# Patient Record
Sex: Male | Born: 1970 | Race: White | Hispanic: No | Marital: Married | Smoking: Current every day smoker
Health system: Southern US, Community
[De-identification: ages and names within clinical notes are randomized; demographics above are authoritative.]

## PROBLEM LIST (undated history)

## (undated) DIAGNOSIS — E039 Hypothyroidism, unspecified: Secondary | ICD-10-CM

## (undated) DIAGNOSIS — G4733 Obstructive sleep apnea (adult) (pediatric): Secondary | ICD-10-CM

## (undated) DIAGNOSIS — E785 Hyperlipidemia, unspecified: Secondary | ICD-10-CM

## (undated) DIAGNOSIS — I10 Essential (primary) hypertension: Secondary | ICD-10-CM

## (undated) HISTORY — DX: Obstructive sleep apnea (adult) (pediatric): G47.33

## (undated) HISTORY — DX: Hyperlipidemia, unspecified: E78.5

## (undated) HISTORY — DX: Essential (primary) hypertension: I10

## (undated) HISTORY — DX: Hypothyroidism, unspecified: E03.9

## (undated) HISTORY — PX: OTHER SURGICAL HISTORY: SHX169

---

## 2000-09-21 ENCOUNTER — Emergency Department (HOSPITAL_COMMUNITY): Admission: EM | Admit: 2000-09-21 | Discharge: 2000-09-21 | Payer: Self-pay | Admitting: *Deleted

## 2003-03-12 ENCOUNTER — Ambulatory Visit (HOSPITAL_COMMUNITY): Admission: RE | Admit: 2003-03-12 | Discharge: 2003-03-12 | Payer: Self-pay | Admitting: Family Medicine

## 2003-03-19 ENCOUNTER — Ambulatory Visit (HOSPITAL_COMMUNITY): Admission: RE | Admit: 2003-03-19 | Discharge: 2003-03-19 | Payer: Self-pay | Admitting: Family Medicine

## 2003-04-07 ENCOUNTER — Encounter (HOSPITAL_COMMUNITY): Admission: RE | Admit: 2003-04-07 | Discharge: 2003-05-07 | Payer: Self-pay | Admitting: Family Medicine

## 2003-05-14 ENCOUNTER — Encounter (HOSPITAL_COMMUNITY): Admission: RE | Admit: 2003-05-14 | Discharge: 2003-06-13 | Payer: Self-pay | Admitting: Family Medicine

## 2003-05-20 ENCOUNTER — Encounter (HOSPITAL_COMMUNITY): Admission: RE | Admit: 2003-05-20 | Discharge: 2003-06-19 | Payer: Self-pay | Admitting: Family Medicine

## 2004-03-31 ENCOUNTER — Ambulatory Visit (HOSPITAL_COMMUNITY): Admission: RE | Admit: 2004-03-31 | Discharge: 2004-03-31 | Payer: Self-pay | Admitting: Family Medicine

## 2005-10-14 ENCOUNTER — Emergency Department (HOSPITAL_COMMUNITY): Admission: EM | Admit: 2005-10-14 | Discharge: 2005-10-14 | Payer: Self-pay | Admitting: Emergency Medicine

## 2005-12-14 ENCOUNTER — Ambulatory Visit (HOSPITAL_COMMUNITY): Admission: RE | Admit: 2005-12-14 | Discharge: 2005-12-14 | Payer: Self-pay | Admitting: Family Medicine

## 2006-09-27 ENCOUNTER — Emergency Department (HOSPITAL_COMMUNITY): Admission: EM | Admit: 2006-09-27 | Discharge: 2006-09-27 | Payer: Self-pay | Admitting: Emergency Medicine

## 2007-05-24 IMAGING — CR DG CHEST 2V
2 series · 2 of 2 positions shown · non-contrast
Comparison: No prior studies.

CLINICAL DATA: Fever.  Tachycardia.  Weakness.  Chest congestion.  
CHEST - 2 VIEW:

[view not recorded (1 of 2)]
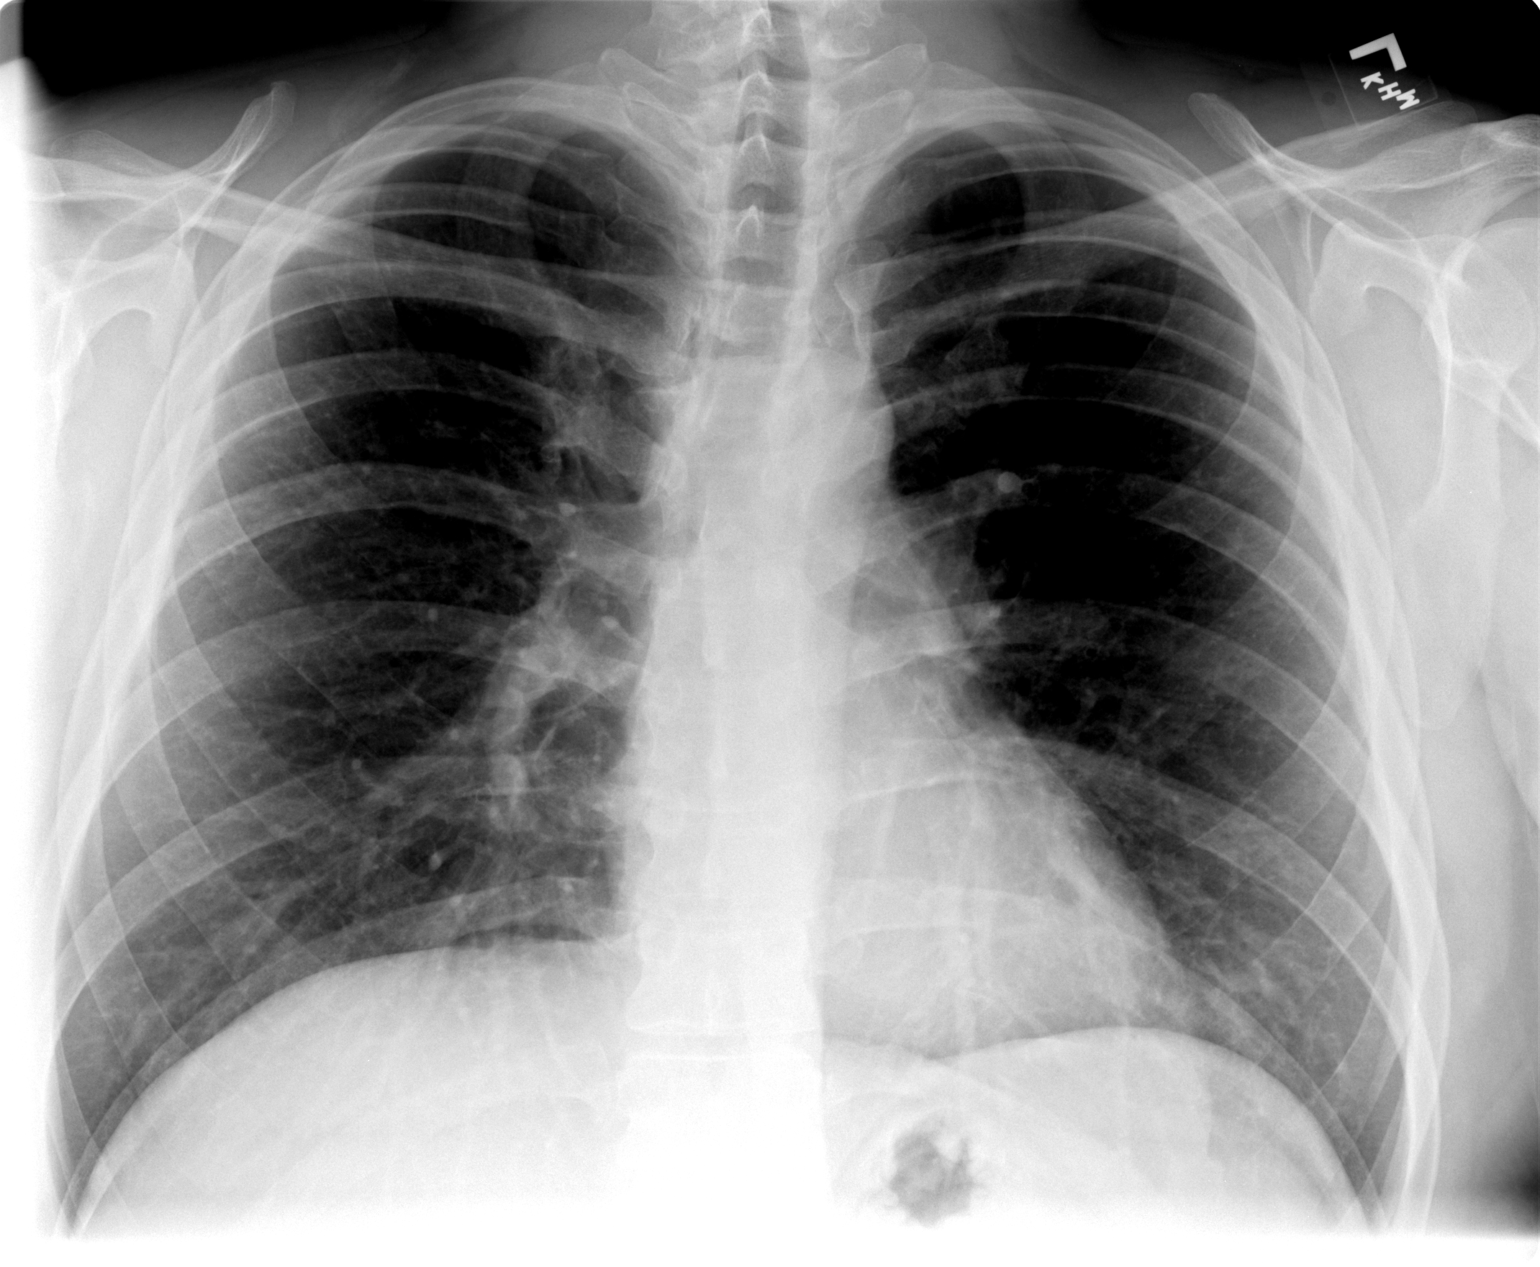

[view not recorded (2 of 2)]
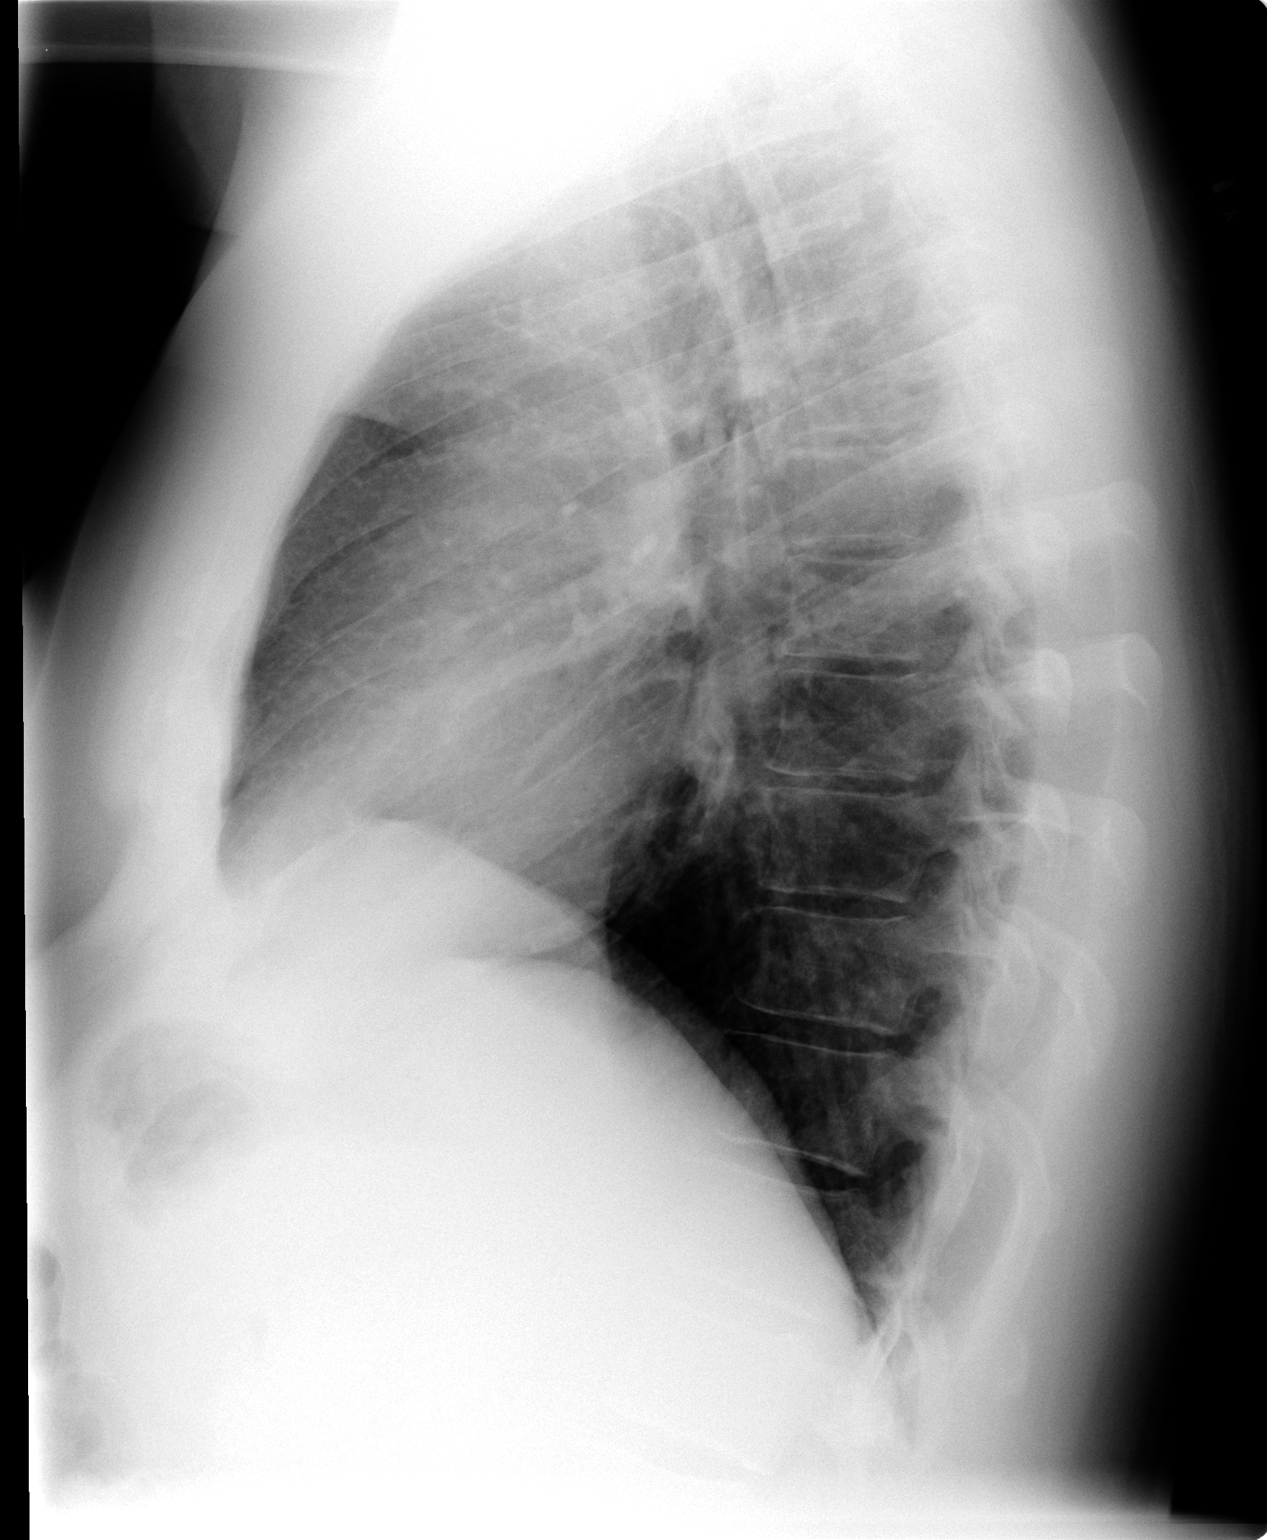

[2 of 2 positions shown; findings below may reference images not displayed]

FINDINGS: The heart and mediastinum appear unremarkable. 
There is a 5 mm density which could represent a nodule projecting over the posterolateral portion of the left 7th rib.  In addition, there is a rounded right suprahilar density which likely actually represents confluence of upper zone pulmonary vessels and is likely to represent adenopathy or a true pulmonary nodule.  Vague density in the lingula is likely vascular.  
No air space opacity is identified.  Heart and mediastinum appear unremarkable.
IMPRESSION: 5 mm vague density projects in the left mid chest.  There is also a suggestion of a rounded right suprahilar opacity which probably simply represents confluent vessels.  However, I do recommend either a chest CT or follow-up chest radiography in 1 months time in order to reevaluate.

## 2007-07-24 IMAGING — CR DG CHEST 2V
2 series · 2 of 2 positions shown · non-contrast
Comparison: 10/14/05.

CLINICAL DATA: Bronchitis.  Shortness of breath.
 CHEST - 2 VIEW: 
 PA and lateral chest - 12/14/05.

[view not recorded (1 of 2)]
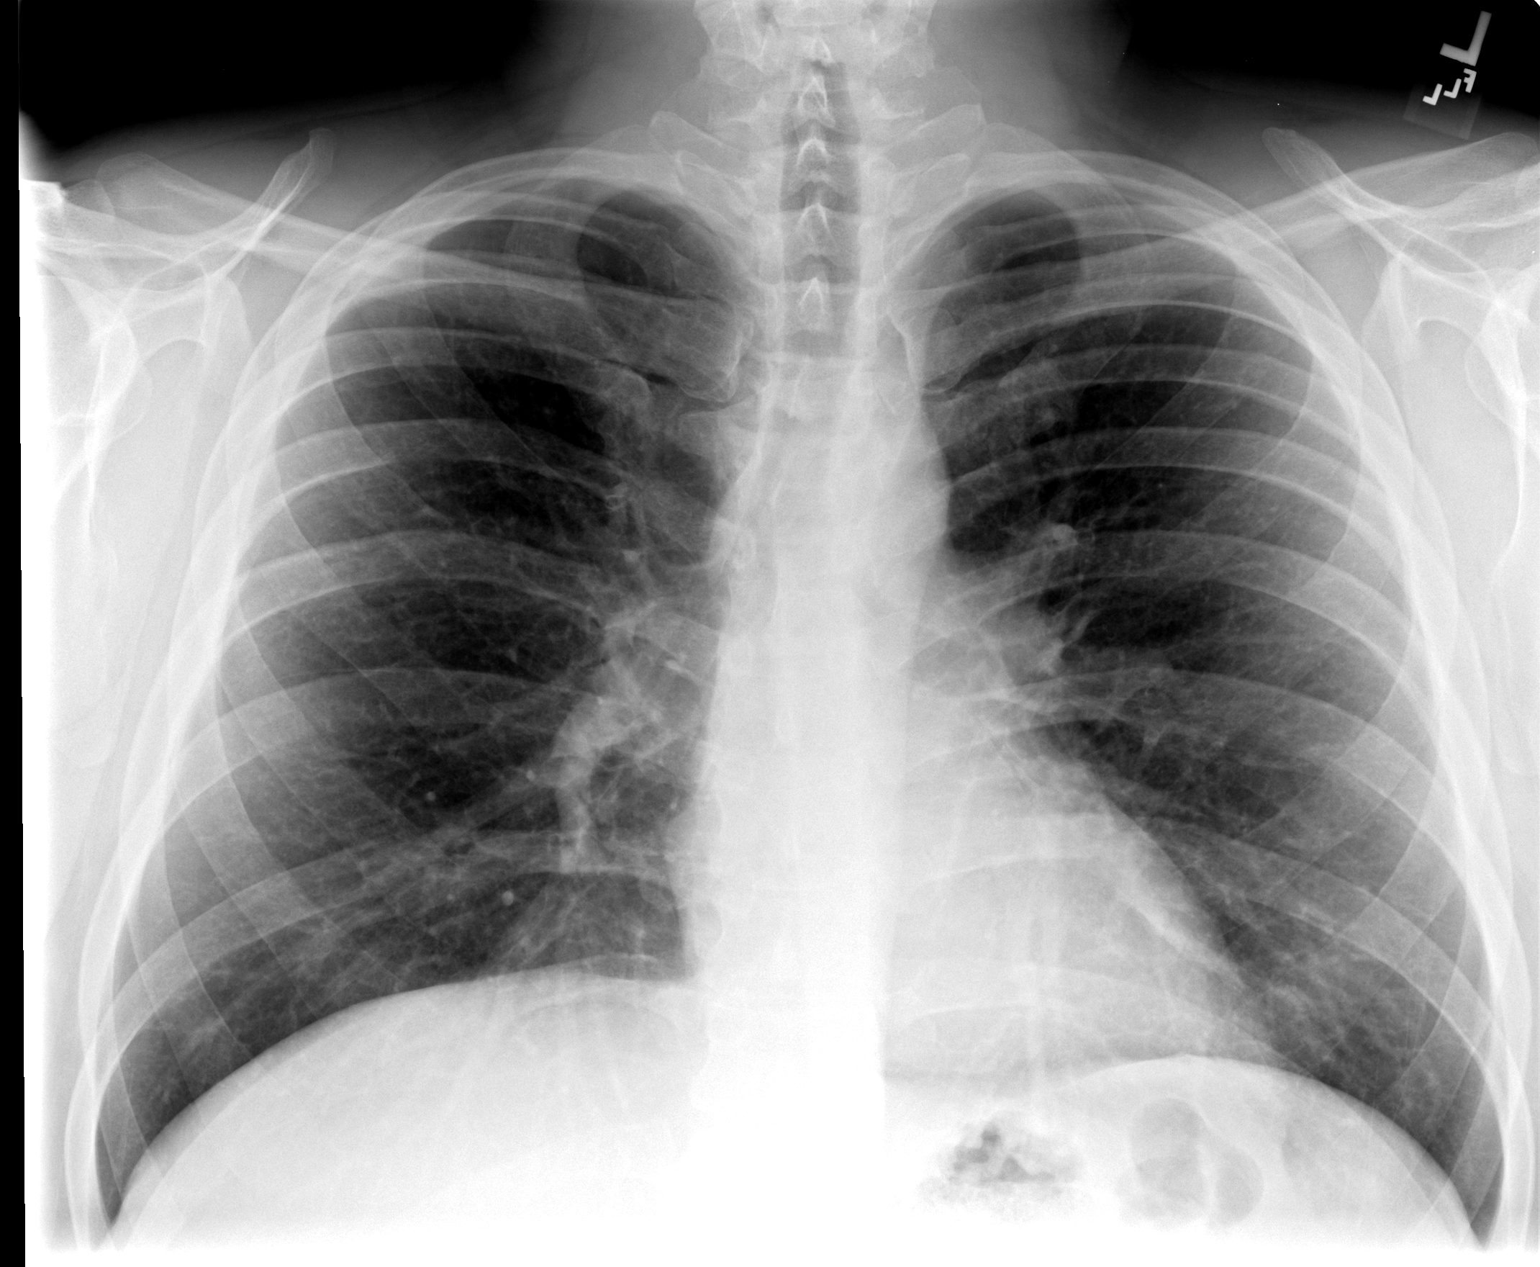

[view not recorded (2 of 2)]
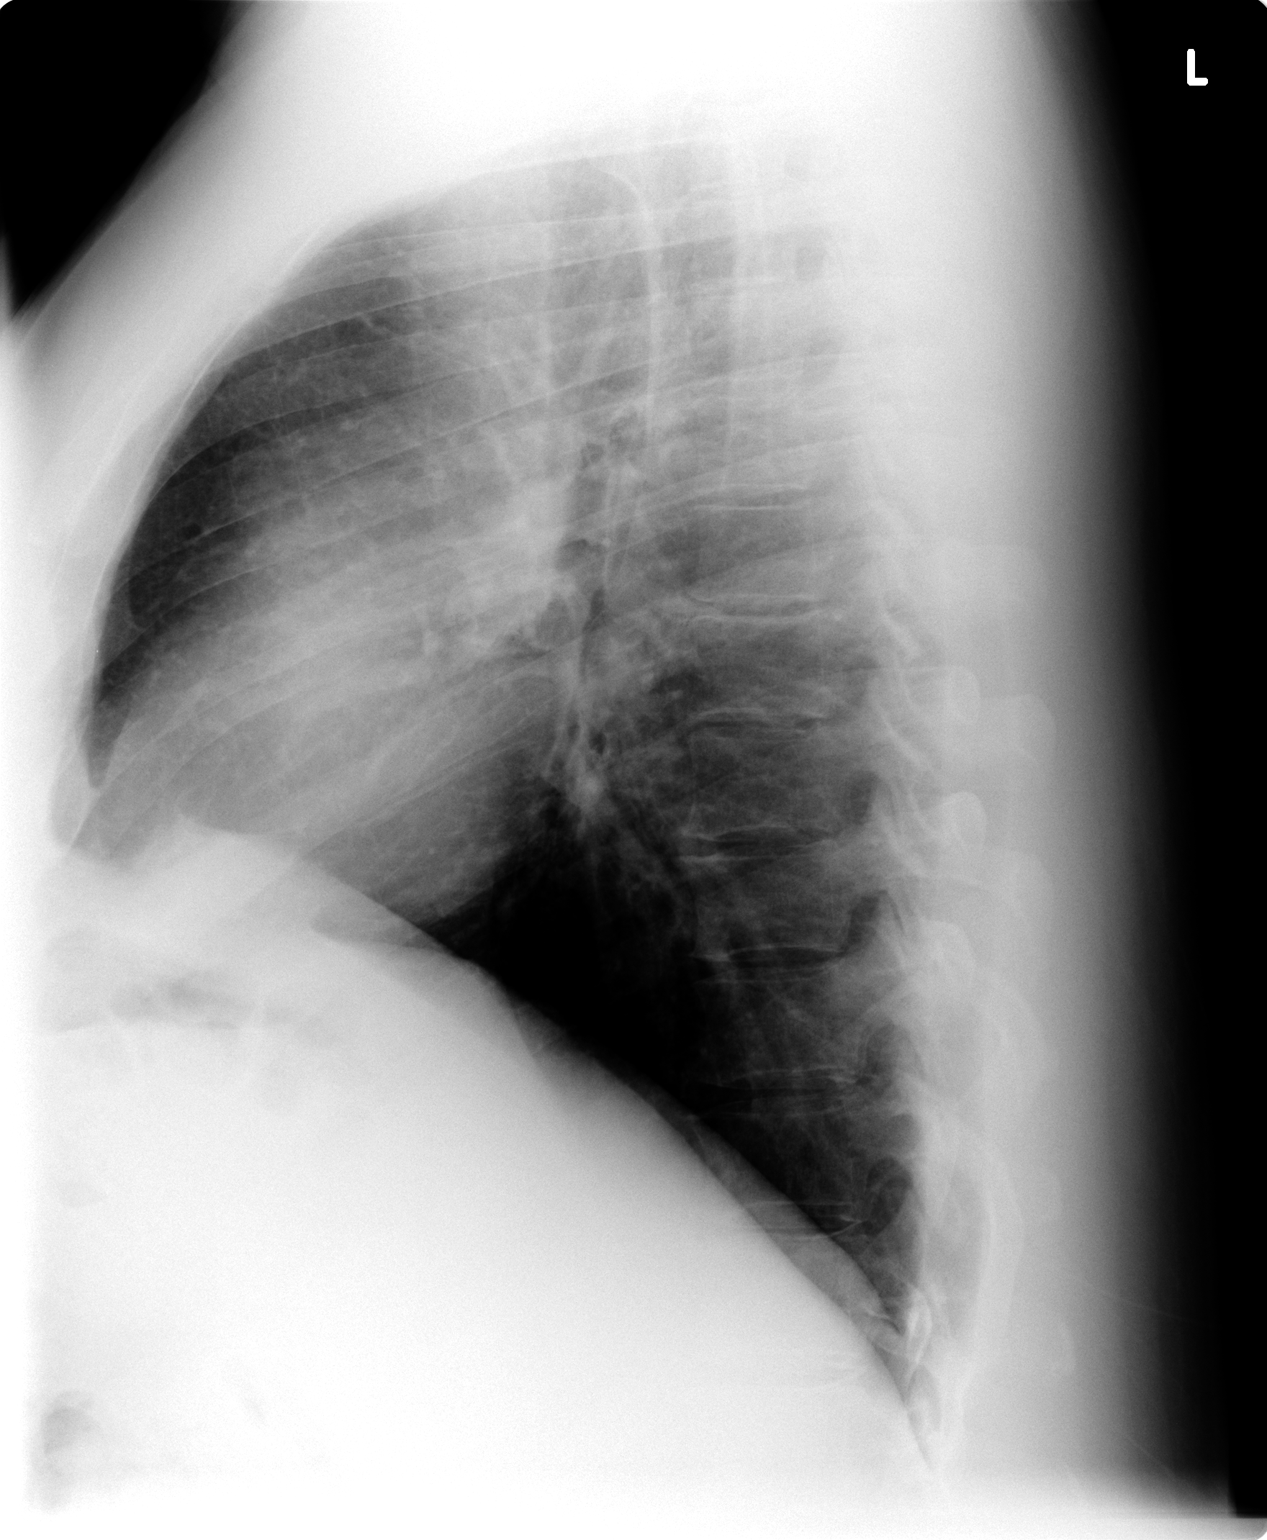

[2 of 2 positions shown; findings below may reference images not displayed]

FINDINGS: The lungs are clear.   The heart size is normal.   No effusion or focal bony abnormality.
IMPRESSION: No acute disease.

## 2008-11-13 ENCOUNTER — Ambulatory Visit (HOSPITAL_COMMUNITY): Admission: RE | Admit: 2008-11-13 | Discharge: 2008-11-13 | Payer: Self-pay | Admitting: Family Medicine

## 2010-02-13 ENCOUNTER — Emergency Department (HOSPITAL_COMMUNITY): Admission: EM | Admit: 2010-02-13 | Discharge: 2010-02-13 | Payer: Self-pay | Admitting: Emergency Medicine

## 2010-07-27 LAB — CBC
HCT: 46.8 % (ref 39.0–52.0)
Hemoglobin: 16.3 g/dL (ref 13.0–17.0)
MCH: 31 pg (ref 26.0–34.0)
MCHC: 34.9 g/dL (ref 30.0–36.0)
MCV: 88.8 fL (ref 78.0–100.0)
Platelets: 189 10*3/uL (ref 150–400)
RBC: 5.27 MIL/uL (ref 4.22–5.81)
RDW: 13 % (ref 11.5–15.5)
WBC: 14.6 10*3/uL — ABNORMAL HIGH (ref 4.0–10.5)

## 2010-07-27 LAB — URINALYSIS, ROUTINE W REFLEX MICROSCOPIC
Bilirubin Urine: NEGATIVE
Glucose, UA: NEGATIVE mg/dL
Hgb urine dipstick: NEGATIVE
Nitrite: NEGATIVE
Protein, ur: NEGATIVE mg/dL
Specific Gravity, Urine: >1.03 — ABNORMAL HIGH (ref 1.005–1.030)
Urobilinogen, UA: 0.2 mg/dL (ref 0.0–1.0)
pH: 6 (ref 5.0–8.0)

## 2010-07-27 LAB — DIFFERENTIAL
Basophils Absolute: 0.1 10*3/uL (ref 0.0–0.1)
Basophils Relative: 0 % (ref 0–1)
Eosinophils Absolute: 0.2 10*3/uL (ref 0.0–0.7)
Eosinophils Relative: 1 % (ref 0–5)
Lymphocytes Relative: 17 % (ref 12–46)
Lymphs Abs: 2.5 10*3/uL (ref 0.7–4.0)
Monocytes Absolute: 0.9 10*3/uL (ref 0.1–1.0)
Monocytes Relative: 6 % (ref 3–12)
Neutro Abs: 10.9 10*3/uL — ABNORMAL HIGH (ref 1.7–7.7)
Neutrophils Relative %: 75 % (ref 43–77)

## 2010-07-27 LAB — COMPREHENSIVE METABOLIC PANEL
ALT: 53 U/L (ref 0–53)
AST: 32 U/L (ref 0–37)
Albumin: 4.2 g/dL (ref 3.5–5.2)
Alkaline Phosphatase: 71 U/L (ref 39–117)
BUN: 7 mg/dL (ref 6–23)
CO2: 28 mEq/L (ref 19–32)
Calcium: 9.1 mg/dL (ref 8.4–10.5)
Chloride: 103 mEq/L (ref 96–112)
Creatinine, Ser: 0.77 mg/dL (ref 0.4–1.5)
GFR calc Af Amer: >60 mL/min (ref >60)
GFR calc non Af Amer: >60 mL/min (ref >60)
Glucose, Bld: 84 mg/dL (ref 70–99)
Potassium: 3.7 mEq/L (ref 3.5–5.1)
Sodium: 138 mEq/L (ref 135–145)
Total Bilirubin: 1.6 mg/dL — ABNORMAL HIGH (ref 0.3–1.2)
Total Protein: 7 g/dL (ref 6.0–8.3)

## 2010-07-27 LAB — LIPASE, BLOOD: Lipase: 42 U/L (ref 11–59)

## 2011-09-23 IMAGING — CR DG CHEST 2V
2 series · 2 of 2 positions shown · non-contrast
Comparison: Chest 12/14/2005.

CLINICAL DATA: Chest pain.  Low grade fever.

CHEST - 2 VIEW

[view not recorded (1 of 2)]
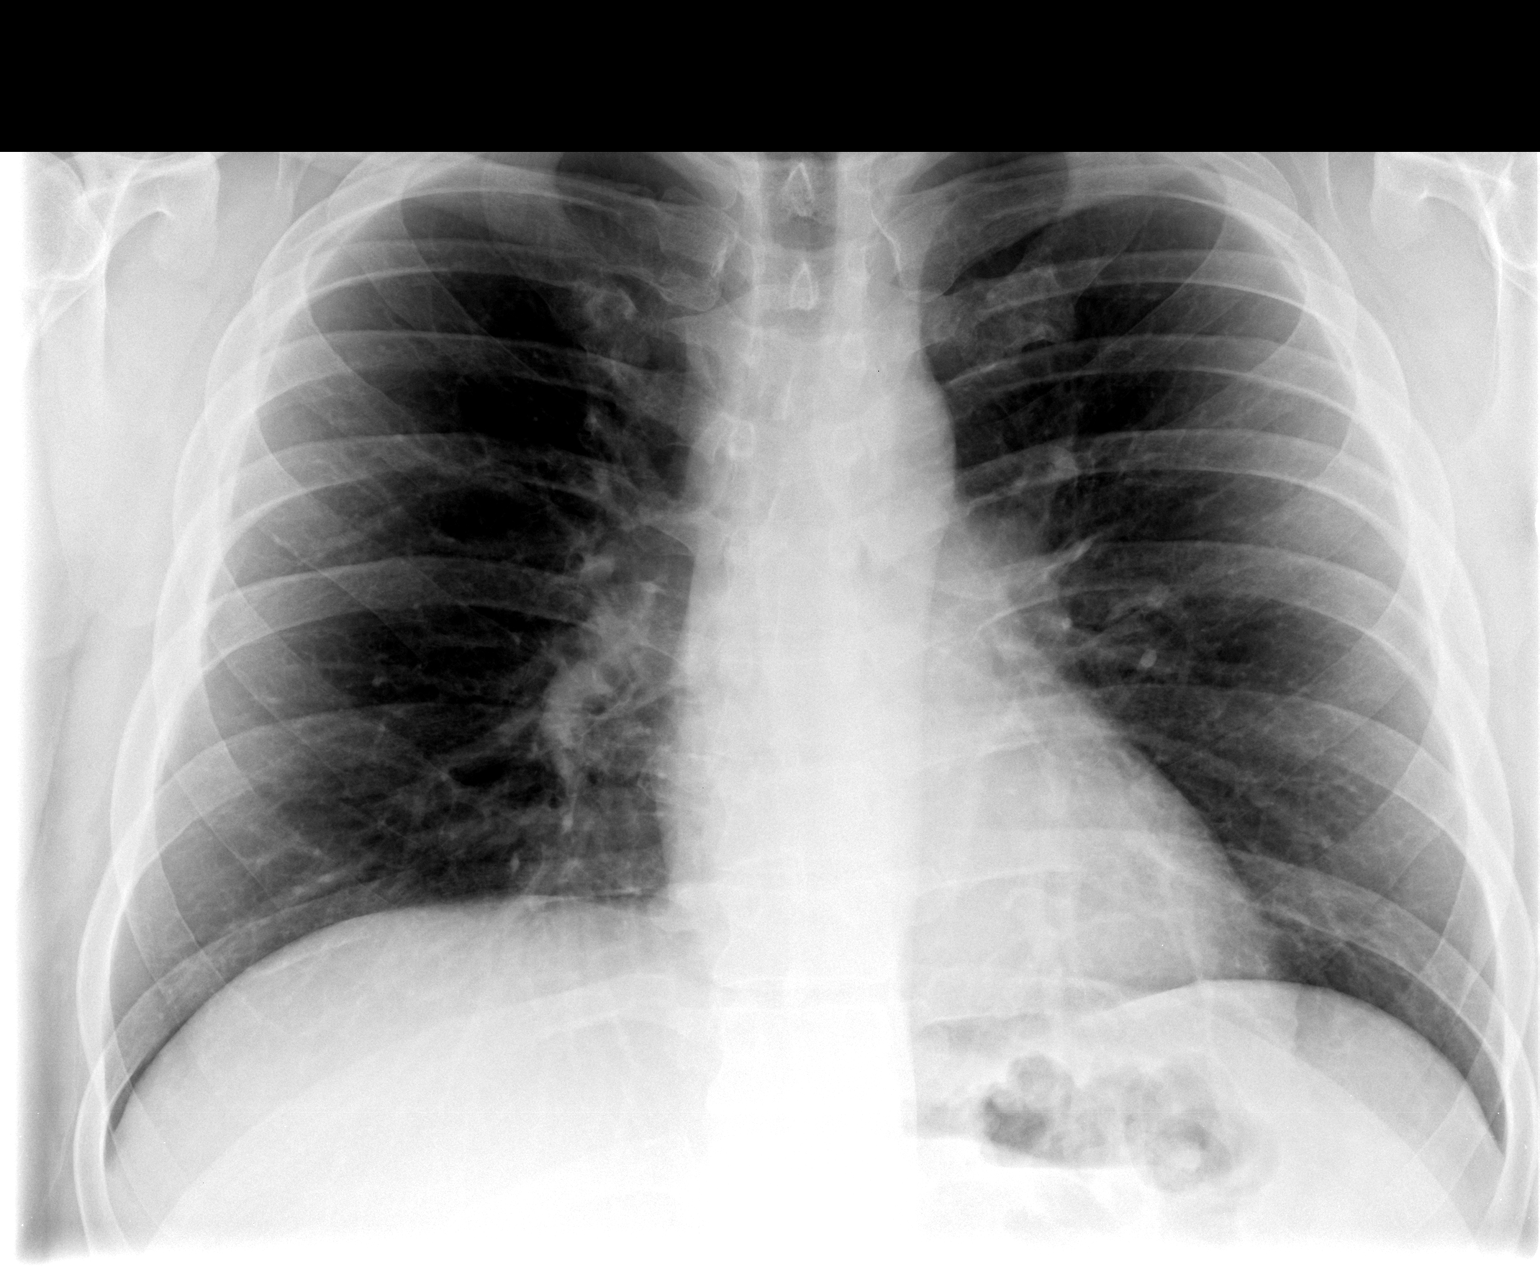

[view not recorded (2 of 2)]
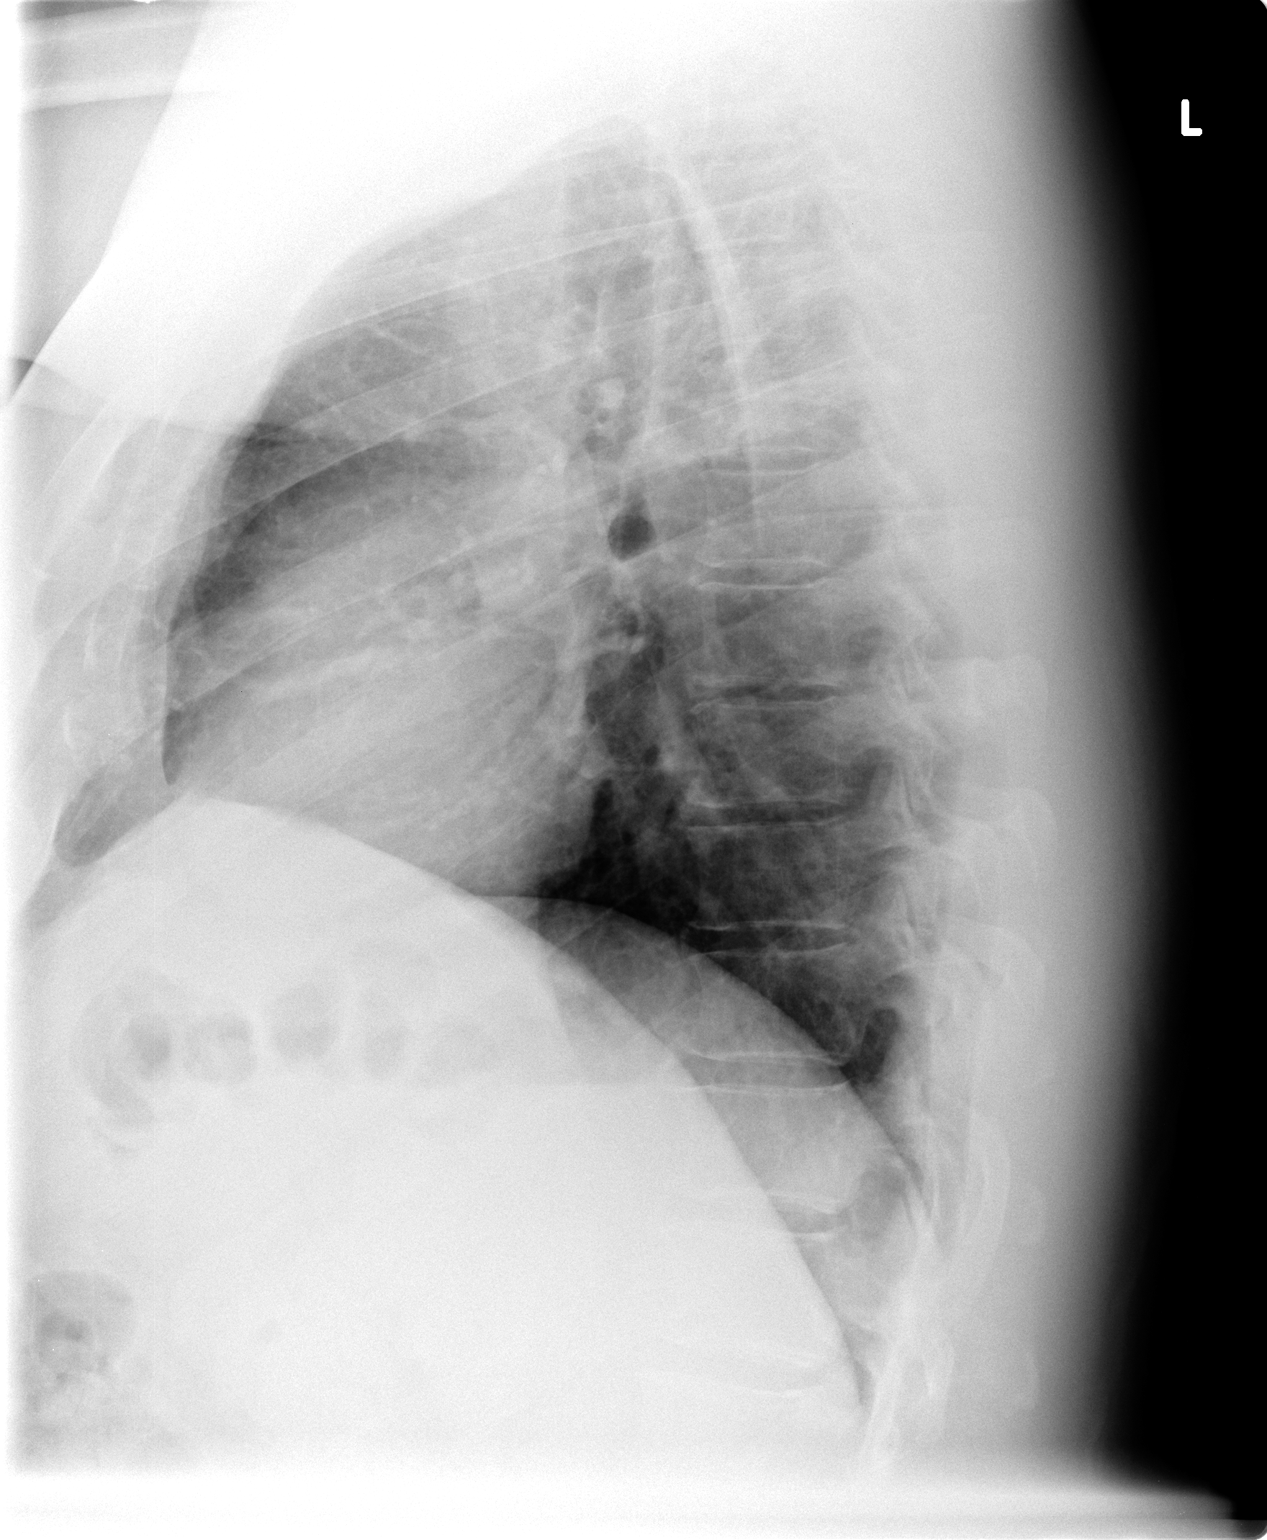

[2 of 2 positions shown; findings below may reference images not displayed]

FINDINGS: The lungs are clear.  No pleural effusion or
pneumothorax.  Heart size normal.
IMPRESSION: Negative chest.

## 2012-11-18 ENCOUNTER — Ambulatory Visit (INDEPENDENT_AMBULATORY_CARE_PROVIDER_SITE_OTHER): Payer: BC Managed Care – PPO | Admitting: Family Medicine

## 2012-11-18 ENCOUNTER — Encounter: Payer: Self-pay | Admitting: Family Medicine

## 2012-11-18 VITALS — BP 128/90 | HR 80 | Ht 72.0 in | Wt 278.0 lb

## 2012-11-18 DIAGNOSIS — I10 Essential (primary) hypertension: Secondary | ICD-10-CM

## 2012-11-18 DIAGNOSIS — E781 Pure hyperglyceridemia: Secondary | ICD-10-CM

## 2012-11-18 MED ORDER — ENALAPRIL MALEATE 10 MG PO TABS: 10.0000 mg | ORAL_TABLET | Freq: Every day | ORAL | Status: DC

## 2012-11-18 MED ORDER — OFLOXACIN 0.3 % OT SOLN: 5.0000 [drp] | Freq: Two times a day (BID) | OTIC | Status: DC

## 2012-11-18 MED ORDER — FENOFIBRATE 160 MG PO TABS: ORAL_TABLET | ORAL | Status: DC

## 2012-11-18 NOTE — Progress Notes (Signed)
  Subjective:    Patient ID: Chase Carson, male    DOB: 1970-06-09, 42 y.o.   MRN: [id number]  Hypertension This is a chronic problem. The current episode started more than 1 year ago. The problem has been gradually worsening since onset. The problem is controlled. Past treatments include ACE inhibitors. The current treatment provides moderate improvement. Compliance problems include exercise.  There is no history of angina, CAD/MI or CVA. There is no history of chronic renal disease.   Off lipid meds the past half yr, he stopped the medication because his triglycerides were not very much improved according to him watching diet, not exercising much  Ear irritated left and now right  Temp flare of prostate infxn now absent Review of Systems No chest pain no shortness of breath no abdominal pain review systems otherwise negative    Objective:   Physical Exam Alert no acute distress. Lungs clear. Heart regular rate and rhythm. HEENT external ear inflammation. Ankles without edema vitals reviewed.       Assessment & Plan:  Impression hypertension good control. #2 hyperlipidemia I reminded patient to reason he was on the triglyceride medicine was also to help bump up his HDL because his is genetically extremely well. #3 external otitis. Plan patient to resume fenofibrate one half tablet twice a day. This is his preferred way of taking it. Floxin drops 4 drops twice a day affected ear. Symptomatic care discussed maintain blood pressure medicine diet and exercise discussed. Encouraged to stop smoking. Check every 6 months. Blood work then since not on medication currently.

## 2012-11-19 DIAGNOSIS — I10 Essential (primary) hypertension: Secondary | ICD-10-CM | POA: Insufficient documentation

## 2012-11-19 DIAGNOSIS — E781 Pure hyperglyceridemia: Secondary | ICD-10-CM | POA: Insufficient documentation

## 2013-01-24 ENCOUNTER — Encounter: Payer: Self-pay | Admitting: Family Medicine

## 2013-01-24 ENCOUNTER — Ambulatory Visit (INDEPENDENT_AMBULATORY_CARE_PROVIDER_SITE_OTHER): Payer: BC Managed Care – PPO | Admitting: Family Medicine

## 2013-01-24 VITALS — BP 132/94 | Temp 101.5°F | Ht 72.0 in | Wt 274.0 lb

## 2013-01-24 DIAGNOSIS — R509 Fever, unspecified: Secondary | ICD-10-CM

## 2013-01-24 DIAGNOSIS — J329 Chronic sinusitis, unspecified: Secondary | ICD-10-CM

## 2013-01-24 MED ORDER — CEFTRIAXONE SODIUM 1 G IJ SOLR
1.0000 g | Freq: Once | INTRAMUSCULAR | Status: AC
  Administered 2013-01-24: 1 g via INTRAMUSCULAR

## 2013-01-24 MED ORDER — CLARITHROMYCIN 500 MG PO TABS: 500.0000 mg | ORAL_TABLET | Freq: Two times a day (BID) | ORAL | Status: DC

## 2013-01-24 NOTE — Progress Notes (Signed)
  Subjective:    Patient ID: Chase Carson, male    DOB: [birth year], 42 y.o.   MRN: [id number]  Fever  This is a new problem. The current episode started in the past 7 days. The problem has been rapidly worsening. His temperature was unmeasured prior to arrival. Associated symptoms include congestion, coughing, headaches, muscle aches, sleepiness, a sore throat and wheezing. He has tried acetaminophen and NSAIDs for the symptoms. The treatment provided no relief.   Temp felt warm, no vom or diarrhea  otc took mucinex chest and cold, tyl and cold and flu Review of Systems  Constitutional: Positive for fever.  HENT: Positive for congestion and sore throat.   Respiratory: Positive for cough and wheezing.   Neurological: Positive for headaches.       Objective:   Physical Exam  Alert mild malaise. Hydration good. Vitals reviewed. Nasal congestion. Occasional cough. No tachypnea no crackles heart regular rate and rhythm.      Assessment & Plan:  Impression rhinosinusitis with bronchitis doubt pneumonia though possible discussed. Plan appropriate antibiotics. Symptomatic care discussed. Warning signs discussed. WSL

## 2013-03-31 ENCOUNTER — Telehealth: Payer: Self-pay | Admitting: Family Medicine

## 2013-03-31 DIAGNOSIS — E785 Hyperlipidemia, unspecified: Secondary | ICD-10-CM

## 2013-03-31 DIAGNOSIS — Z79899 Other long term (current) drug therapy: Secondary | ICD-10-CM

## 2013-03-31 NOTE — Telephone Encounter (Signed)
Lip liv m7 

## 2013-03-31 NOTE — Telephone Encounter (Signed)
Pt needs BW papers for appt please call when sent

## 2013-04-18 LAB — HEPATIC FUNCTION PANEL
ALT: 36 U/L (ref 0–53)
AST: 25 U/L (ref 0–37)
Albumin: 4.3 g/dL (ref 3.5–5.2)
Alkaline Phosphatase: 88 U/L (ref 39–117)
Bilirubin, Direct: 0.1 mg/dL (ref 0.0–0.3)
Indirect Bilirubin: 0.6 mg/dL (ref 0.0–0.9)
Total Bilirubin: 0.7 mg/dL (ref 0.3–1.2)
Total Protein: 6.8 g/dL (ref 6.0–8.3)

## 2013-04-18 LAB — LIPID PANEL
Cholesterol: 171 mg/dL (ref 0–200)
HDL: 25 mg/dL — ABNORMAL LOW (ref >39)
LDL Cholesterol: 85 mg/dL (ref 0–99)
Total CHOL/HDL Ratio: 6.8 Ratio
Triglycerides: 305 mg/dL — ABNORMAL HIGH (ref <150)
VLDL: 61 mg/dL — ABNORMAL HIGH (ref 0–40)

## 2013-04-18 LAB — BASIC METABOLIC PANEL
BUN: 9 mg/dL (ref 6–23)
CO2: 29 mEq/L (ref 19–32)
Calcium: 9.1 mg/dL (ref 8.4–10.5)
Chloride: 105 mEq/L (ref 96–112)
Creat: 0.7 mg/dL (ref 0.50–1.35)
Glucose, Bld: 81 mg/dL (ref 70–99)
Potassium: 4.2 mEq/L (ref 3.5–5.3)
Sodium: 142 mEq/L (ref 135–145)

## 2013-05-01 ENCOUNTER — Ambulatory Visit (INDEPENDENT_AMBULATORY_CARE_PROVIDER_SITE_OTHER): Payer: BC Managed Care – PPO | Admitting: Family Medicine

## 2013-05-01 ENCOUNTER — Encounter: Payer: Self-pay | Admitting: Family Medicine

## 2013-05-01 VITALS — BP 136/88 | Ht 72.0 in | Wt 270.8 lb

## 2013-05-01 DIAGNOSIS — I10 Essential (primary) hypertension: Secondary | ICD-10-CM

## 2013-05-01 DIAGNOSIS — J329 Chronic sinusitis, unspecified: Secondary | ICD-10-CM

## 2013-05-01 DIAGNOSIS — E781 Pure hyperglyceridemia: Secondary | ICD-10-CM

## 2013-05-01 MED ORDER — FENOFIBRATE 160 MG PO TABS: ORAL_TABLET | ORAL | Status: DC

## 2013-05-01 MED ORDER — AMOXICILLIN-POT CLAVULANATE 875-125 MG PO TABS: 1.0000 | ORAL_TABLET | Freq: Two times a day (BID) | ORAL | Status: AC

## 2013-05-01 MED ORDER — ENALAPRIL MALEATE 10 MG PO TABS: 10.0000 mg | ORAL_TABLET | Freq: Every day | ORAL | Status: DC

## 2013-05-01 NOTE — Progress Notes (Signed)
   Subjective:    Patient ID: Chase Carson, male    DOB: [birth year], 42 y.o.   MRN: [id number]  HPI  Patient arrives for a follow up on cholesterol and blood pressure.Fair amount of exercise at work   lot of walking  Claims compliance with meds,  BP med takes in the evemin,  trigly med takes genrally at bedtime  Still smoking, wonders about "vaping",  fa had g i cancer, and a lot of exposure to chemicals Results for orders placed in visit on 03/31/13  LIPID PANEL      Result Value Range   Cholesterol 171  0 - 200 mg/dL   Triglycerides 409 (*) <150 mg/dL   HDL 25 (*) >81 mg/dL   Total CHOL/HDL Ratio 6.8     VLDL 61 (*) 0 - 40 mg/dL   LDL Cholesterol 85  0 - 99 mg/dL  HEPATIC FUNCTION PANEL      Result Value Range   Total Bilirubin 0.7  0.3 - 1.2 mg/dL   Bilirubin, Direct 0.1  0.0 - 0.3 mg/dL   Indirect Bilirubin 0.6  0.0 - 0.9 mg/dL   Alkaline Phosphatase 88  39 - 117 U/L   AST 25  0 - 37 U/L   ALT 36  0 - 53 U/L   Total Protein 6.8  6.0 - 8.3 g/dL   Albumin 4.3  3.5 - 5.2 g/dL  BASIC METABOLIC PANEL      Result Value Range   Sodium 142  135 - 145 mEq/L   Potassium 4.2  3.5 - 5.3 mEq/L   Chloride 105  96 - 112 mEq/L   CO2 29  19 - 32 mEq/L   Glucose, Bld 81  70 - 99 mg/dL   BUN 9  6 - 23 mg/dL   Creat 1.91  4.78 - 2.95 mg/dL   Calcium 9.1  8.4 - 62.1 mg/dL    A full fenofibrste causes constipation  Frontal headache congestion fullness. Coughing up some S. Intermittent epistaxis. Diminished energy headache comes and goes sharp in nature. Review of Systems No chest pain no wheezing no dyspnea no back pain no abdominal pain no change in bowel habits no blood in stools ROS otherwise negative    Objective:   Physical Exam  alert vitals reviewed. Blood pressure 1:30 to rate he repeat. H neck supple pharynx normal. Lungs clear heart rare in rhythm.ET moderate his congestion frI frontal tenderness. Ankles without edema   mpression 1 rhinosinusitis discussed.  #2 hypertension good control. #3 hyperlipidemia unfortunately patient cannot tolerate higher meds. Admits to dietary indiscretions regularly plan diet exercise discussed maintain same meds. discuss strongly encouraged to stop smoking WSL      see above       Assessment & Plan:

## 2013-06-18 ENCOUNTER — Telehealth: Payer: Self-pay | Admitting: Family Medicine

## 2013-06-18 NOTE — Telephone Encounter (Signed)
relafen 750 bid ten d

## 2013-06-18 NOTE — Telephone Encounter (Signed)
Pt not feeling well, his left leg and hip area are having shooting pains from his lower back down. Has been applying ben gay and taking motrin but this is just taking the edge off. Progressively  Gotten worse for 7 days.   Can we please call him in something for this to ease the pain?    [facility]

## 2013-06-19 MED ORDER — NABUMETONE 750 MG PO TABS: 750.0000 mg | ORAL_TABLET | Freq: Two times a day (BID) | ORAL | Status: DC

## 2013-06-19 NOTE — Telephone Encounter (Signed)
Patient notified

## 2013-08-04 ENCOUNTER — Ambulatory Visit (INDEPENDENT_AMBULATORY_CARE_PROVIDER_SITE_OTHER): Payer: BC Managed Care – PPO | Admitting: Family Medicine

## 2013-08-04 ENCOUNTER — Encounter: Payer: Self-pay | Admitting: Family Medicine

## 2013-08-04 VITALS — BP 134/96 | Ht 72.0 in | Wt 284.0 lb

## 2013-08-04 DIAGNOSIS — R300 Dysuria: Secondary | ICD-10-CM

## 2013-08-04 LAB — POCT URINALYSIS DIPSTICK
Spec Grav, UA: 1.02
pH, UA: 6

## 2013-08-04 MED ORDER — CIPROFLOXACIN HCL 750 MG PO TABS: 750.0000 mg | ORAL_TABLET | Freq: Two times a day (BID) | ORAL | Status: DC

## 2013-08-04 NOTE — Progress Notes (Signed)
   Subjective:    Patient ID: Chase Carson, male    DOB: 13-Jan-1971, 43 y.o.   MRN: [id number]  HPI Patient is here today d/t urinary problems.  He is having problems with starting and stopping when urinating. Also problem with a steady stream.      Review of Systems     Objective:   Physical Exam        Assessment & Plan:

## 2013-11-17 ENCOUNTER — Other Ambulatory Visit: Payer: Self-pay | Admitting: Family Medicine

## 2013-12-12 ENCOUNTER — Encounter: Payer: Self-pay | Admitting: Family Medicine

## 2013-12-12 ENCOUNTER — Ambulatory Visit (INDEPENDENT_AMBULATORY_CARE_PROVIDER_SITE_OTHER): Payer: BC Managed Care – PPO | Admitting: Family Medicine

## 2013-12-12 VITALS — BP 120/82 | Ht 72.0 in | Wt 279.6 lb

## 2013-12-12 DIAGNOSIS — E781 Pure hyperglyceridemia: Secondary | ICD-10-CM

## 2013-12-12 DIAGNOSIS — E782 Mixed hyperlipidemia: Secondary | ICD-10-CM

## 2013-12-12 DIAGNOSIS — N529 Male erectile dysfunction, unspecified: Secondary | ICD-10-CM

## 2013-12-12 DIAGNOSIS — Z79899 Other long term (current) drug therapy: Secondary | ICD-10-CM

## 2013-12-12 DIAGNOSIS — N521 Erectile dysfunction due to diseases classified elsewhere: Secondary | ICD-10-CM

## 2013-12-12 DIAGNOSIS — I10 Essential (primary) hypertension: Secondary | ICD-10-CM

## 2013-12-12 MED ORDER — FENOFIBRATE 160 MG PO TABS: ORAL_TABLET | ORAL | Status: DC

## 2013-12-12 MED ORDER — NABUMETONE 750 MG PO TABS: 750.0000 mg | ORAL_TABLET | Freq: Two times a day (BID) | ORAL | Status: DC | PRN

## 2013-12-12 MED ORDER — SILDENAFIL CITRATE 50 MG PO TABS
ORAL_TABLET | ORAL | Status: DC
Start: 2013-12-12 — End: 2015-08-03

## 2013-12-12 MED ORDER — ENALAPRIL MALEATE 10 MG PO TABS: ORAL_TABLET | ORAL | Status: DC

## 2013-12-12 NOTE — Progress Notes (Signed)
   Subjective:    Patient ID: Chase Carson, male    DOB: 1970/08/08, 43 y.o.   MRN: [id number]  Hypertension This is a chronic problem. The current episode started more than 1 year ago. There are no associated agents to hypertension. Risk factors for coronary artery disease include male gender and dyslipidemia. Treatments tried: vasotec.   Cont to work in hot sweaty situation   Patient having problem with left heel pain, hurts and inflammed and tender with pressure   trouble on thru the night.  restless legs at night.  Adding more lettuce to the sandiwch and wathing diet  Patient claims compliance with her lipid medication. No obvious side effects from it. Trying to watch diet. Not exercising as much as he would hope.  Progressive difficulties with erections over the past year. Patient does have multiple risk factors in this regard. Discussed patient would like to try medicine.  Review of Systems    no chest pain no headache no back pain no abdominal pain no change in bowel habits no blood in stool ROS otherwise negative Objective:   Physical Exam  Alert no apparent distress. Lungs clear. Heart regular in rhythm. H&T normal. Ankles without edema.      Assessment & Plan:  Impression 1 erectile dysfunction discussed #2 hypertension good control. #3 hyperlipidemia discussed plan appropriate blood work discussed. Diet exercise discussed. Trial of Viagra. Maintain other meds. Quit smoking. Start exercising. Followup as scheduled. WSL

## 2013-12-13 LAB — LIPID PANEL
CHOL/HDL RATIO: 6.8 ratio
Cholesterol: 169 mg/dL (ref 0–200)
HDL: 25 mg/dL — AB (ref >39)
LDL CALC: 82 mg/dL (ref 0–99)
TRIGLYCERIDES: 310 mg/dL — AB (ref <150)
VLDL: 62 mg/dL — AB (ref 0–40)

## 2013-12-13 LAB — HEPATIC FUNCTION PANEL
ALBUMIN: 4.6 g/dL (ref 3.5–5.2)
ALT: 44 U/L (ref 0–53)
AST: 25 U/L (ref 0–37)
Alkaline Phosphatase: 71 U/L (ref 39–117)
Bilirubin, Direct: 0.2 mg/dL (ref 0.0–0.3)
Indirect Bilirubin: 0.8 mg/dL (ref 0.2–1.2)
TOTAL PROTEIN: 6.6 g/dL (ref 6.0–8.3)
Total Bilirubin: 1 mg/dL (ref 0.2–1.2)

## 2013-12-14 DIAGNOSIS — N529 Male erectile dysfunction, unspecified: Secondary | ICD-10-CM | POA: Insufficient documentation

## 2013-12-15 ENCOUNTER — Encounter: Payer: Self-pay | Admitting: Family Medicine

## 2014-04-14 ENCOUNTER — Ambulatory Visit (INDEPENDENT_AMBULATORY_CARE_PROVIDER_SITE_OTHER): Payer: BC Managed Care – PPO | Admitting: Family Medicine

## 2014-04-14 ENCOUNTER — Encounter: Payer: Self-pay | Admitting: Family Medicine

## 2014-04-14 VITALS — BP 134/92 | Ht 72.0 in | Wt 276.0 lb

## 2014-04-14 DIAGNOSIS — M722 Plantar fascial fibromatosis: Secondary | ICD-10-CM

## 2014-04-14 MED ORDER — PREDNISONE 20 MG PO TABS: ORAL_TABLET | ORAL | Status: DC

## 2014-04-14 MED ORDER — CIPROFLOXACIN HCL 750 MG PO TABS: 750.0000 mg | ORAL_TABLET | Freq: Two times a day (BID) | ORAL | Status: DC

## 2014-04-14 NOTE — Progress Notes (Signed)
   Subjective:    Patient ID: Cherie OuchChristopher E Inga, male    DOB: 08-23-1970, 43 y.o.   MRN: [id number]  Foot Pain This is a new problem. The current episode started more than 1 month ago (2 months ago). The problem has been waxing and waning. Associated symptoms include congestion, coughing and a sore throat. Associated symptoms comments: Sinus issues started Friday. The symptoms are aggravated by standing and walking. He has tried lying down, immobilization, position changes, rest and heat for the symptoms. The treatment provided mild relief.    Left foot painful  After a few min on the feet can hurt  Couple mo ago  No sudden injury  No stomping or kicking problem  Wears wolverine comp toes with durashocks   curent shoes eight mo old  Sinus cong mess and drainage, dim enrgy achey and both tonsils inflammmed    Review of Systems  HENT: Positive for congestion and sore throat.   Respiratory: Positive for cough.    No vomiting no diarrhea    Objective:   Physical Exam  Alert moderate malaise. HEENT moderate nasal congestion pharynx slight erythema lungs clear heart regular in rhythm.    Right heel distinctly tender good range of motion. Sensory exam normal pulses good    Assessment & Plan:   Impression 1 rhinosinusitis #2 left heel fasciitis discussed plan ankle stretching discussed. Prednisone taper. Local measures. Change footwear. Antibiotics prescribed. WSL

## 2014-05-19 ENCOUNTER — Encounter: Payer: Self-pay | Admitting: Family Medicine

## 2014-05-19 ENCOUNTER — Ambulatory Visit (INDEPENDENT_AMBULATORY_CARE_PROVIDER_SITE_OTHER): Payer: BLUE CROSS/BLUE SHIELD | Admitting: Family Medicine

## 2014-05-19 VITALS — Ht 72.0 in

## 2014-05-19 DIAGNOSIS — B349 Viral infection, unspecified: Secondary | ICD-10-CM

## 2014-05-19 DIAGNOSIS — J310 Chronic rhinitis: Secondary | ICD-10-CM

## 2014-05-19 DIAGNOSIS — J329 Chronic sinusitis, unspecified: Secondary | ICD-10-CM

## 2014-05-19 MED ORDER — CIPROFLOXACIN HCL 500 MG PO TABS: 500.0000 mg | ORAL_TABLET | Freq: Two times a day (BID) | ORAL | Status: AC

## 2014-05-19 NOTE — Progress Notes (Signed)
   Subjective:    Patient ID: Chase Carson, male    DOB: Nov 15, 1970, 44 y.o.   MRN: [id number]  Fever  This is a new problem. The current episode started in the past 7 days. Associated symptoms include abdominal pain, congestion, diarrhea, headaches, muscle aches and vomiting. He has tried acetaminophen and NSAIDs for the symptoms.    Bad coughing fit  Sig achiness and heada che  Diarrhea freq  Highest temp low grade  Frontal and cheeks  Some bloody disch left nasal area  Cough mostly drainage and choking  Joints aching and   Motrin using three tabs three times per day  Puny energy level      Review of Systems  Constitutional: Positive for fever.  HENT: Positive for congestion.   Gastrointestinal: Positive for vomiting, abdominal pain and diarrhea.  Neurological: Positive for headaches.       Objective:   Physical Exam Alert moderate malaise. Vitals stable. Some dry mucous membranes with likely mild dehydration. HEENT moderate nasal congestion frontal tenderness pharynx slight erythema neck supple lungs bronchial cough heart regular in rhythm       Assessment & Plan:  Impression moderately severe viral syndrome secondary sinusitis and dehydration plan does not need hospital but will definitely need to remain off work this week. Antibiotics prescribed. Hydration discussed. Warning signs discussed carefully WSL

## 2014-05-20 DIAGNOSIS — Z0289 Encounter for other administrative examinations: Secondary | ICD-10-CM

## 2014-07-02 ENCOUNTER — Other Ambulatory Visit: Payer: Self-pay | Admitting: *Deleted

## 2014-07-02 MED ORDER — ENALAPRIL MALEATE 10 MG PO TABS: ORAL_TABLET | ORAL | Status: DC

## 2014-07-10 ENCOUNTER — Telehealth: Payer: Self-pay | Admitting: Family Medicine

## 2014-07-10 NOTE — Telephone Encounter (Signed)
Patient has appointment on 3/4 for medcheck and needing labs done.

## 2014-07-10 NOTE — Telephone Encounter (Signed)
12/12/13: lip, liv

## 2014-07-17 ENCOUNTER — Encounter: Payer: Self-pay | Admitting: Family Medicine

## 2014-07-17 ENCOUNTER — Ambulatory Visit (INDEPENDENT_AMBULATORY_CARE_PROVIDER_SITE_OTHER): Payer: BLUE CROSS/BLUE SHIELD | Admitting: Family Medicine

## 2014-07-17 VITALS — BP 110/70 | Ht 72.0 in | Wt 276.4 lb

## 2014-07-17 DIAGNOSIS — Z79899 Other long term (current) drug therapy: Secondary | ICD-10-CM

## 2014-07-17 DIAGNOSIS — E785 Hyperlipidemia, unspecified: Secondary | ICD-10-CM

## 2014-07-17 DIAGNOSIS — N5201 Erectile dysfunction due to arterial insufficiency: Secondary | ICD-10-CM

## 2014-07-17 DIAGNOSIS — I10 Essential (primary) hypertension: Secondary | ICD-10-CM | POA: Diagnosis not present

## 2014-07-17 DIAGNOSIS — E781 Pure hyperglyceridemia: Secondary | ICD-10-CM | POA: Diagnosis not present

## 2014-07-17 MED ORDER — FENOFIBRATE 160 MG PO TABS
ORAL_TABLET | ORAL | Status: DC
Start: 2014-07-17 — End: 2015-01-07

## 2014-07-17 MED ORDER — ENALAPRIL MALEATE 10 MG PO TABS: ORAL_TABLET | ORAL | Status: DC

## 2014-07-17 NOTE — Progress Notes (Signed)
   Subjective:    Patient ID: Chase OuchChristopher E Gassner, male    DOB: Dec 28, 1970, 44 y.o.   MRN: [id number]  Hypertension This is a chronic problem. The current episode started more than 1 year ago. The problem has been gradually improving since onset. The problem is controlled. There are no associated agents to hypertension. There are no known risk factors for coronary artery disease. Treatments tried: enalapril. The current treatment provides significant improvement. There are no compliance problems.    Patient states that he has no other concerns at this time.   Results for orders placed or performed in visit on 12/12/13  Lipid panel  Result Value Ref Range   Cholesterol 169 0 - 200 mg/dL   Triglycerides 782310 (H) <150 mg/dL   HDL 25 (L) >95>39 mg/dL   Total CHOL/HDL Ratio 6.8 Ratio   VLDL 62 (H) 0 - 40 mg/dL   LDL Cholesterol 82 0 - 99 mg/dL  Hepatic function panel  Result Value Ref Range   Total Bilirubin 1.0 0.2 - 1.2 mg/dL   Bilirubin, Direct 0.2 0.0 - 0.3 mg/dL   Indirect Bilirubin 0.8 0.2 - 1.2 mg/dL   Alkaline Phosphatase 71 39 - 117 U/L   AST 25 0 - 37 U/L   ALT 44 0 - 53 U/L   Total Protein 6.6 6.0 - 8.3 g/dL   Albumin 4.6 3.5 - 5.2 g/dL   compliant with lipid medication. No obvious side effects. Trying to watch his diet.  Unfortunately still smoking.  Review of Systems No headache no chest pain no back pain no abdominal pain no change in bowel habits    Objective:   Physical Exam  Alert vital stable HEENT normal. Lungs clear. Heart regular in rhythm. Ankles without edema.      Assessment & Plan:  Impression 1 hypertension good control discussed #2 hyperlipidemia status uncertain #3 erectile dysfunction ongoing but improved patient trying to go without medications discussed #4 chronic smoking with some associated dyspnea with exertion. Discussion held regarding taping versus regular cigarettes. Plan appropriate blood work. Further recommendations based on results. Diet  exercise discussed. Recheck in 6 months. WSL

## 2014-08-02 ENCOUNTER — Encounter: Payer: Self-pay | Admitting: Family Medicine

## 2014-08-02 LAB — BASIC METABOLIC PANEL
BUN/Creatinine Ratio: 6 — ABNORMAL LOW (ref 9–20)
BUN: 5 mg/dL — AB (ref 6–24)
CO2: 23 mmol/L (ref 18–29)
Calcium: 8.8 mg/dL (ref 8.7–10.2)
Chloride: 103 mmol/L (ref 97–108)
Creatinine, Ser: 0.79 mg/dL (ref 0.76–1.27)
GFR, EST AFRICAN AMERICAN: 127 mL/min/{1.73_m2} (ref >59)
GFR, EST NON AFRICAN AMERICAN: 110 mL/min/{1.73_m2} (ref >59)
Glucose: 107 mg/dL — ABNORMAL HIGH (ref 65–99)
Potassium: 4.2 mmol/L (ref 3.5–5.2)
Sodium: 141 mmol/L (ref 134–144)

## 2014-08-02 LAB — LIPID PANEL
Chol/HDL Ratio: 9.7 ratio units — ABNORMAL HIGH (ref 0.0–5.0)
Cholesterol, Total: 185 mg/dL (ref 100–199)
HDL: 19 mg/dL — AB (ref >39)
Triglycerides: 576 mg/dL (ref 0–149)

## 2014-08-02 LAB — HEPATIC FUNCTION PANEL
ALT: 32 IU/L (ref 0–44)
AST: 18 IU/L (ref 0–40)
Albumin: 4.4 g/dL (ref 3.5–5.5)
Alkaline Phosphatase: 93 IU/L (ref 39–117)
BILIRUBIN, DIRECT: 0.13 mg/dL (ref 0.00–0.40)
Bilirubin Total: 0.3 mg/dL (ref 0.0–1.2)
Total Protein: 6.2 g/dL (ref 6.0–8.5)

## 2015-01-07 ENCOUNTER — Encounter: Payer: Self-pay | Admitting: Family Medicine

## 2015-01-07 ENCOUNTER — Ambulatory Visit (INDEPENDENT_AMBULATORY_CARE_PROVIDER_SITE_OTHER): Payer: BLUE CROSS/BLUE SHIELD | Admitting: Family Medicine

## 2015-01-07 VITALS — BP 120/88 | Ht 72.0 in | Wt 287.0 lb

## 2015-01-07 DIAGNOSIS — M722 Plantar fascial fibromatosis: Secondary | ICD-10-CM | POA: Diagnosis not present

## 2015-01-07 DIAGNOSIS — J329 Chronic sinusitis, unspecified: Secondary | ICD-10-CM

## 2015-01-07 DIAGNOSIS — E785 Hyperlipidemia, unspecified: Secondary | ICD-10-CM

## 2015-01-07 DIAGNOSIS — I10 Essential (primary) hypertension: Secondary | ICD-10-CM

## 2015-01-07 DIAGNOSIS — J310 Chronic rhinitis: Secondary | ICD-10-CM

## 2015-01-07 MED ORDER — ETODOLAC 400 MG PO TABS: 400.0000 mg | ORAL_TABLET | Freq: Two times a day (BID) | ORAL | Status: DC

## 2015-01-07 MED ORDER — ENALAPRIL MALEATE 10 MG PO TABS: ORAL_TABLET | ORAL | Status: DC

## 2015-01-07 MED ORDER — AMOXICILLIN-POT CLAVULANATE 875-125 MG PO TABS: 1.0000 | ORAL_TABLET | Freq: Two times a day (BID) | ORAL | Status: AC

## 2015-01-07 NOTE — Progress Notes (Signed)
   Subjective:    Patient ID: Chase Carson, male    DOB: Jan 02, 1971, 44 y.o.   MRN: [id number]  Hypertension This is a chronic problem. The current episode started more than 1 year ago. The problem has been gradually improving since onset. There are no associated agents to hypertension. There are no known risk factors for coronary artery disease. Treatments tried: enalapril. The current treatment provides moderate improvement. There are no compliance problems.    Patient states that he is having left foot pain that has been present for several months now. Worse in left heel. Recalls no sudden injury. This is occurred before.  Patient states that he has sinus pressure in his head and sneezing a lot. This has been present for about 1 week now. Headache cough congestion stuffiness.  Not sticking with lipid meds. Claims the Lopid cause multiple difficulties with muscle and joint soreness and aching  Diet decent but not awesome    Review of Systems No headache no chest pain no back pain no abdominal pain no change in bowel habits    Objective:   Physical Exam  Alert vitals stable HET moderate nasal congestion. Frontal tenderness trace normal neck supple. Lungs clear. Heart regular in rhythm. Ankles without edema left heel distinctly tender to palpation.      Assessment & Plan:  Impression 1 hypertension good control #2 rhinosinusitis discussed over 3 he'll fasciitis discuss #4 hyperlipidemia unfortunately unable to take the medication. Discussed plan diet exercise discussed. Antibiotics prescribed. Lodine when necessary for heel pain. Maintain other medications. Recheck every 6 months. Hold off on blood work at this time [employer]

## 2015-01-08 ENCOUNTER — Ambulatory Visit: Payer: BLUE CROSS/BLUE SHIELD | Admitting: Family Medicine

## 2015-01-12 ENCOUNTER — Ambulatory Visit: Payer: BLUE CROSS/BLUE SHIELD | Admitting: Family Medicine

## 2015-04-26 ENCOUNTER — Ambulatory Visit (INDEPENDENT_AMBULATORY_CARE_PROVIDER_SITE_OTHER): Payer: BLUE CROSS/BLUE SHIELD | Admitting: Family Medicine

## 2015-04-26 ENCOUNTER — Encounter: Payer: Self-pay | Admitting: Family Medicine

## 2015-04-26 VITALS — BP 128/82 | Temp 98.9°F | Ht 72.0 in | Wt 286.6 lb

## 2015-04-26 DIAGNOSIS — J329 Chronic sinusitis, unspecified: Secondary | ICD-10-CM

## 2015-04-26 MED ORDER — CLARITHROMYCIN 500 MG PO TABS: 500.0000 mg | ORAL_TABLET | Freq: Two times a day (BID) | ORAL | Status: AC

## 2015-04-26 NOTE — Progress Notes (Signed)
   Subjective:    Patient ID: Chase Carson, male    DOB: May 09, 1971, 44 y.o.   MRN: [id number]  Sinus Problem This is a new problem. The current episode started in the past 7 days. Associated symptoms include congestion, ear pain and headaches. Treatments tried: motrin.   hwadache and cong  Fullness right  No major  Chest symptoms  Dim energy   Low gr fever, felt warm,  'Unfortunately patient smokes Review of Systems  HENT: Positive for congestion and ear pain.   Neurological: Positive for headaches.       Objective:   Physical Exam  Alert mild malaise. Frontal maxillary tenderness breaks erythematous neck supple lungs clear heart regular in rhythm.      Assessment & Plan:  Impression post viral rhinosinusitis plan antibiotics prescribed. Symptom care discussed warning signs discussed WSL

## 2015-06-28 ENCOUNTER — Encounter: Payer: Self-pay | Admitting: Family Medicine

## 2015-06-28 ENCOUNTER — Ambulatory Visit (INDEPENDENT_AMBULATORY_CARE_PROVIDER_SITE_OTHER): Payer: BLUE CROSS/BLUE SHIELD | Admitting: Family Medicine

## 2015-06-28 VITALS — BP 142/98 | Temp 99.8°F | Ht 72.0 in | Wt 286.0 lb

## 2015-06-28 DIAGNOSIS — J329 Chronic sinusitis, unspecified: Secondary | ICD-10-CM

## 2015-06-28 DIAGNOSIS — N410 Acute prostatitis: Secondary | ICD-10-CM | POA: Diagnosis not present

## 2015-06-28 MED ORDER — HYDROCODONE-HOMATROPINE 5-1.5 MG/5ML PO SYRP: ORAL_SOLUTION | ORAL | Status: DC

## 2015-06-28 MED ORDER — CIPROFLOXACIN HCL 500 MG PO TABS: 500.0000 mg | ORAL_TABLET | Freq: Two times a day (BID) | ORAL | Status: DC

## 2015-06-28 NOTE — Progress Notes (Signed)
   Subjective:    Patient ID: Chase Carson, male    DOB: June 06, 1970, 45 y.o.   MRN: [id number]  Cough This is a new problem. The current episode started yesterday. Associated symptoms include a fever, headaches, nasal congestion and a sore throat. Associated symptoms comments: abd pain, painful urination. . Treatments tried: nyquil, dayquil.   Prostate infection. Pt states he cannot urinate. Unable to get urine sample. Having painful urination. Increased urinary frequency. Of note on NyQuil but the symptoms started before then  For greater then 48 hours is had cough. Headache diffuse in nature. Fever off-and-on. Diminished energy achiness diminished appetite  Review of Systems  Constitutional: Positive for fever.  HENT: Positive for sore throat.   Respiratory: Positive for cough.   Neurological: Positive for headaches.       Objective:   Physical Exam Alert low-grade fever. Vital stable moderate malaise HEENT moderate nasal congestion frontal normal lungs clear heart regular in rhythm prostate boggy and tender       Assessment & Plan:  Impression 1 acute prostatitis #2 post viral rhinosinusitis plan antibiotics prescribed. Symptom care discussed warning signs discussed work excuse written Monday through Thursday WSL

## 2015-07-02 DIAGNOSIS — Z029 Encounter for administrative examinations, unspecified: Secondary | ICD-10-CM

## 2015-08-03 ENCOUNTER — Ambulatory Visit (INDEPENDENT_AMBULATORY_CARE_PROVIDER_SITE_OTHER): Payer: BLUE CROSS/BLUE SHIELD | Admitting: Family Medicine

## 2015-08-03 ENCOUNTER — Encounter: Payer: Self-pay | Admitting: Family Medicine

## 2015-08-03 VITALS — BP 128/78 | Ht 72.0 in | Wt 283.5 lb

## 2015-08-03 DIAGNOSIS — I10 Essential (primary) hypertension: Secondary | ICD-10-CM

## 2015-08-03 DIAGNOSIS — N521 Erectile dysfunction due to diseases classified elsewhere: Secondary | ICD-10-CM | POA: Diagnosis not present

## 2015-08-03 DIAGNOSIS — N410 Acute prostatitis: Secondary | ICD-10-CM | POA: Diagnosis not present

## 2015-08-03 DIAGNOSIS — G2581 Restless legs syndrome: Secondary | ICD-10-CM | POA: Insufficient documentation

## 2015-08-03 MED ORDER — PRAMIPEXOLE DIHYDROCHLORIDE 0.5 MG PO TABS: ORAL_TABLET | ORAL | Status: DC

## 2015-08-03 MED ORDER — ENALAPRIL MALEATE 10 MG PO TABS: ORAL_TABLET | ORAL | Status: DC

## 2015-08-03 MED ORDER — CIPROFLOXACIN HCL 500 MG PO TABS: 500.0000 mg | ORAL_TABLET | Freq: Three times a day (TID) | ORAL | Status: DC

## 2015-08-03 MED ORDER — TADALAFIL 20 MG PO TABS: ORAL_TABLET | ORAL | Status: DC

## 2015-08-03 NOTE — Progress Notes (Signed)
   Subjective:    Patient ID: Chase Carson, male    DOB: [birth year], 45 y.o.   MRN: [id number]  patient arrives office with several distinct concerns Hypertension This is a chronic problem. The current episode started more than 1 year ago. The problem has been gradually improving since onset. The problem is controlled. There are no associated agents to hypertension. There are no known risk factors for coronary artery disease. Treatments tried: enalapril. The current treatment provides moderate improvement. There are no compliance problems.    claims compliance with blood pressure medicine.  Patient has left knee discomfort. On further history it's more of a nocturnal challenge. Restless legs. Left leg months so than right. Patient notes slight knee pain on during the day. But more problematic is both legs jumping and poor sleep hygiene at night. Patient definitely would like to try medicine for.   Onset about 2 years ago.  Erectile dysfunction. Feels Viagra is not working would like to try Cialis. Also start and have prostate issues again frequency burning hesitation discomfort Patient wants to discuss his prostate issues also.       Review of Systems  no high fevers no chest pain no headache no abdominal pain no change in bowel habits    Objective:   Physical Exam   alert vital stable blood pressure excellent on repeat H&T normal lungs clear heart rhythm boggy tender prostate left knee slight crepitation      Assessment & Plan:   impression 1 acute prostatitis #2 restless leg syndrome by history discussed time to try meds #3 erectile dysfunction suboptimum control #4 hypertension use control plan antibiotics prescribed. Trial of sialoliths. Trial of Mirapex for restless leg syndrome

## 2015-09-03 ENCOUNTER — Other Ambulatory Visit: Payer: Self-pay | Admitting: *Deleted

## 2015-09-05 NOTE — Progress Notes (Signed)
Ok six mo worth 

## 2015-09-06 MED ORDER — PRAMIPEXOLE DIHYDROCHLORIDE 0.5 MG PO TABS: ORAL_TABLET | ORAL | Status: DC

## 2016-01-11 ENCOUNTER — Telehealth: Payer: Self-pay | Admitting: Family Medicine

## 2016-01-11 DIAGNOSIS — I10 Essential (primary) hypertension: Secondary | ICD-10-CM

## 2016-01-11 DIAGNOSIS — Z79899 Other long term (current) drug therapy: Secondary | ICD-10-CM

## 2016-01-11 NOTE — Telephone Encounter (Signed)
Wife notified bloodwork ordered.

## 2016-01-11 NOTE — Telephone Encounter (Signed)
Pt is requesting lab orders to be called in for an upcoming bp check. Last labs per epic were: bmp,hepatic and lipid on 3/12.

## 2016-01-11 NOTE — Telephone Encounter (Signed)
Lip liv 

## 2016-01-11 NOTE — Telephone Encounter (Signed)
And met 7 in addtn to lip liv

## 2016-01-20 ENCOUNTER — Encounter: Payer: Self-pay | Admitting: Family Medicine

## 2016-01-20 ENCOUNTER — Ambulatory Visit (INDEPENDENT_AMBULATORY_CARE_PROVIDER_SITE_OTHER): Payer: BLUE CROSS/BLUE SHIELD | Admitting: Family Medicine

## 2016-01-20 VITALS — BP 142/90 | Ht 72.0 in | Wt 294.2 lb

## 2016-01-20 DIAGNOSIS — N521 Erectile dysfunction due to diseases classified elsewhere: Secondary | ICD-10-CM | POA: Diagnosis not present

## 2016-01-20 DIAGNOSIS — I10 Essential (primary) hypertension: Secondary | ICD-10-CM | POA: Diagnosis not present

## 2016-01-20 DIAGNOSIS — E781 Pure hyperglyceridemia: Secondary | ICD-10-CM | POA: Diagnosis not present

## 2016-01-20 DIAGNOSIS — G2581 Restless legs syndrome: Secondary | ICD-10-CM

## 2016-01-20 MED ORDER — ROPINIROLE HCL 0.5 MG PO TABS: 0.5000 mg | ORAL_TABLET | Freq: Every day | ORAL | 5 refills | Status: DC

## 2016-01-20 MED ORDER — ENALAPRIL MALEATE 10 MG PO TABS: ORAL_TABLET | ORAL | 5 refills | Status: DC

## 2016-01-20 NOTE — Progress Notes (Signed)
   Subjective:    Patient ID: Chase Carson, male    DOB: March 08, 1971, 45 y.o.   MRN: [id number]  Hypertension  This is a chronic problem. The current episode started more than 1 year ago. The problem has been gradually improving since onset. There are no associated agents to hypertension. There are no known risk factors for coronary artery disease. Treatments tried: enalapril. The current treatment provides moderate improvement. There are no compliance problems.   Blood pressure medicine and blood pressure levels reviewed today with patient. Compliant with blood pressure medicine. States does not miss a dose. No obvious side effects. Blood pressure generally good when checked elsewhere. Watching salt intake.  Still sig wtress level at work Pt tried bp meds at half dose  mirapex  Did not hlp caused drowsiness, and jumping of the left leg.Ongoing challenges with restless leg syndrome. Frustrated by it. Would like to try a new medication.  History of very high triglycerides. Patient off medication now. Did not go get blood work. Prior blood work results reviewed. Await further results to decide medication or not    Patient is having some ear discomfort and dizziness. , last under thirty seconds, Pos exposure to irritants and oils, Notes some pain off and on, with off balance too    Review of Systems No headache, no major weight loss or weight gain, no chest pain no back pain abdominal pain no change in bowel habits complete ROS otherwise negative     Objective:   Physical Exam  Alert vitals stable, NAD. Blood pressure good on repeat. HEENT normal. Lungs clear. Heart regular rate and rhythm.       Assessment & Plan:  Impression hypertension good control discussed maintain same #2 hypertriglyceridemia long discussion held. May need medicine await blood work #3 restless leg syndrome. Unsatisfactory side effects with last medication discussed #4 erectile dysfunction. Ongoing. Patient  thought was his blood pressure medicine educated otherwise plan medications refilled. Appropriate blood work diet exercise discussed recheck in 6 months WSL

## 2016-01-21 DIAGNOSIS — I10 Essential (primary) hypertension: Secondary | ICD-10-CM | POA: Diagnosis not present

## 2016-01-21 DIAGNOSIS — Z79899 Other long term (current) drug therapy: Secondary | ICD-10-CM | POA: Diagnosis not present

## 2016-01-22 LAB — HEPATIC FUNCTION PANEL
ALBUMIN: 4.6 g/dL (ref 3.5–5.5)
ALK PHOS: 97 IU/L (ref 39–117)
ALT: 98 IU/L — AB (ref 0–44)
AST: 62 IU/L — AB (ref 0–40)
BILIRUBIN TOTAL: 1 mg/dL (ref 0.0–1.2)
Bilirubin, Direct: 0.22 mg/dL (ref 0.00–0.40)
Total Protein: 7.3 g/dL (ref 6.0–8.5)

## 2016-01-22 LAB — BASIC METABOLIC PANEL
BUN / CREAT RATIO: 12 (ref 9–20)
BUN: 9 mg/dL (ref 6–24)
CO2: 26 mmol/L (ref 18–29)
Calcium: 9.5 mg/dL (ref 8.7–10.2)
Chloride: 99 mmol/L (ref 96–106)
Creatinine, Ser: 0.75 mg/dL — ABNORMAL LOW (ref 0.76–1.27)
GFR, EST AFRICAN AMERICAN: 129 mL/min/{1.73_m2} (ref >59)
GFR, EST NON AFRICAN AMERICAN: 112 mL/min/{1.73_m2} (ref >59)
Glucose: 131 mg/dL — ABNORMAL HIGH (ref 65–99)
POTASSIUM: 4.5 mmol/L (ref 3.5–5.2)
SODIUM: 142 mmol/L (ref 134–144)

## 2016-01-22 LAB — LIPID PANEL
CHOLESTEROL TOTAL: 207 mg/dL — AB (ref 100–199)
Chol/HDL Ratio: 9.4 ratio units — ABNORMAL HIGH (ref 0.0–5.0)
HDL: 22 mg/dL — ABNORMAL LOW (ref >39)
TRIGLYCERIDES: 467 mg/dL — AB (ref 0–149)

## 2016-02-17 ENCOUNTER — Encounter: Payer: Self-pay | Admitting: Family Medicine

## 2016-02-17 ENCOUNTER — Ambulatory Visit (INDEPENDENT_AMBULATORY_CARE_PROVIDER_SITE_OTHER): Payer: BLUE CROSS/BLUE SHIELD | Admitting: Family Medicine

## 2016-02-17 VITALS — BP 128/86 | Temp 99.0°F | Ht 72.0 in | Wt 295.0 lb

## 2016-02-17 DIAGNOSIS — I10 Essential (primary) hypertension: Secondary | ICD-10-CM

## 2016-02-17 DIAGNOSIS — R739 Hyperglycemia, unspecified: Secondary | ICD-10-CM

## 2016-02-17 DIAGNOSIS — R748 Abnormal levels of other serum enzymes: Secondary | ICD-10-CM | POA: Insufficient documentation

## 2016-02-17 DIAGNOSIS — R7303 Prediabetes: Secondary | ICD-10-CM | POA: Insufficient documentation

## 2016-02-17 LAB — POCT GLYCOSYLATED HEMOGLOBIN (HGB A1C): HEMOGLOBIN A1C: 6.1

## 2016-02-17 MED ORDER — AMOXICILLIN-POT CLAVULANATE 875-125 MG PO TABS: 1.0000 | ORAL_TABLET | Freq: Two times a day (BID) | ORAL | 0 refills | Status: AC

## 2016-02-17 NOTE — Progress Notes (Signed)
   Subjective:    Patient ID: Cherie OuchChristopher E Howerton, male    DOB: August 29, 1970, 45 y.o.   MRN: [id number] Patient arrives office with numerous concerns. HPIFollow up blood work. Elevated blood sugar. A1C today.   Results for orders placed or performed in visit on 02/17/16  POCT glycosylated hemoglobin (Hb A1C)  Result Value Ref Range   Hemoglobin A1C 6.1    Strong fam hx of diabetes, Admits to up until recently for attention to fat in diet and sugar in diet wqtching diet overall bdtter, pt changing intake  Has gained substantial weight lately. Not exercising as much as he should.  Sinus pressure and drainage, headache. Started yesterday.  Headache frontal cough productive yellowish phlegm. Declines flu vaccine.     Review of Systems No headache, no major weight loss or weight gain, no chest pain no back pain abdominal pain no change in bowel habits complete ROS otherwise negative     Objective:   Physical Exam Alert, mild malaise. Hydration good Vitals stable. frontal/ maxillary tenderness evident positive nasal congestion. pharynx normal neck supple  lungs clear/no crackles or wheezes. heart regular in rhythm        Assessment & Plan:  Impression 1 rhinosinusitis #2 prediabetes discussed at great length. Substantial issue. Patient may well move to diabetes. Needs to reverse that process at all possible. Diet exercise good selection all discussed #3 elevated liver enzymes. New issue. Has gained substantial weight. We'll hold off on full workup that her persists will need to do further tests discussed plan antibiotics prescribed. Symptom care discussed warning signs discussed recheck in 6 months blood work then [facility] minutes spent most in discussion

## 2016-02-17 NOTE — Patient Instructions (Signed)
Prediabetes Eating Plan Prediabetes--also called impaired glucose tolerance or impaired fasting glucose--is a condition that causes blood sugar (blood glucose) levels to be higher than normal. Following a healthy diet can help to keep prediabetes under control. It can also help to lower the risk of type 2 diabetes and heart disease, which are increased in people who have prediabetes. Along with regular exercise, a healthy diet:  Promotes weight loss.  Helps to control blood sugar levels.  Helps to improve the way that the body uses insulin. WHAT DO I NEED TO KNOW ABOUT THIS EATING PLAN?  Use the glycemic index (GI) to plan your meals. The index tells you how quickly a food will raise your blood sugar. Choose low-GI foods. These foods take a longer time to raise blood sugar.  Pay close attention to the amount of carbohydrates in the food that you eat. Carbohydrates increase blood sugar levels.  Keep track of how many calories you take in. Eating the right amount of calories will help you to achieve a healthy weight. Losing about 7 percent of your starting weight can help to prevent type 2 diabetes.  You may want to follow a Mediterranean diet. This diet includes a lot of vegetables, lean meats or fish, whole grains, fruits, and healthy oils and fats. WHAT FOODS CAN I EAT? Grains Whole grains, such as whole-wheat or whole-grain breads, crackers, cereals, and pasta. Unsweetened oatmeal. Bulgur. Barley. Quinoa. Brown rice. Corn or whole-wheat flour tortillas or taco shells. Vegetables Lettuce. Spinach. Peas. Beets. Cauliflower. Cabbage. Broccoli. Carrots. Tomatoes. Squash. Eggplant. Herbs. Peppers. Onions. Cucumbers. Brussels sprouts. Fruits Berries. Bananas. Apples. Oranges. Grapes. Papaya. Mango. Pomegranate. Kiwi. Grapefruit. Cherries. Meats and Other Protein Sources Seafood. Lean meats, such as chicken and turkey or lean cuts of pork and beef. Tofu. Eggs. Nuts. Beans. Dairy Low-fat or  fat-free dairy products, such as yogurt, cottage cheese, and cheese. Beverages Water. Tea. Coffee. Sugar-free or diet soda. Seltzer water. Milk. Milk alternatives, such as soy or almond milk. Condiments Mustard. Relish. Low-fat, low-sugar ketchup. Low-fat, low-sugar barbecue sauce. Low-fat or fat-free mayonnaise. Sweets and Desserts Sugar-free or low-fat pudding. Sugar-free or low-fat ice cream and other frozen treats. Fats and Oils Avocado. Walnuts. Olive oil. The items listed above may not be a complete list of recommended foods or beverages. Contact your dietitian for more options.  WHAT FOODS ARE NOT RECOMMENDED? Grains Refined white flour and flour products, such as bread, pasta, snack foods, and cereals. Beverages Sweetened drinks, such as sweet iced tea and soda. Sweets and Desserts Baked goods, such as cake, cupcakes, pastries, cookies, and cheesecake. The items listed above may not be a complete list of foods and beverages to avoid. Contact your dietitian for more information.   This information is not intended to replace advice given to you by your health care provider. Make sure you discuss any questions you have with your health care provider.   Document Released: 09/15/2014 Document Reviewed: 09/15/2014 Elsevier Interactive Patient Education 2016 Elsevier Inc.  

## 2016-08-15 ENCOUNTER — Ambulatory Visit (INDEPENDENT_AMBULATORY_CARE_PROVIDER_SITE_OTHER): Payer: BLUE CROSS/BLUE SHIELD | Admitting: Family Medicine

## 2016-08-15 ENCOUNTER — Encounter: Payer: Self-pay | Admitting: Family Medicine

## 2016-08-15 VITALS — BP 130/82 | Ht 72.0 in | Wt 283.8 lb

## 2016-08-15 DIAGNOSIS — I10 Essential (primary) hypertension: Secondary | ICD-10-CM | POA: Diagnosis not present

## 2016-08-15 DIAGNOSIS — G2581 Restless legs syndrome: Secondary | ICD-10-CM | POA: Diagnosis not present

## 2016-08-15 DIAGNOSIS — E781 Pure hyperglyceridemia: Secondary | ICD-10-CM

## 2016-08-15 DIAGNOSIS — R0683 Snoring: Secondary | ICD-10-CM

## 2016-08-15 DIAGNOSIS — R7303 Prediabetes: Secondary | ICD-10-CM | POA: Diagnosis not present

## 2016-08-15 DIAGNOSIS — R400 Somnolence: Secondary | ICD-10-CM | POA: Diagnosis not present

## 2016-08-15 DIAGNOSIS — R5383 Other fatigue: Secondary | ICD-10-CM | POA: Diagnosis not present

## 2016-08-15 DIAGNOSIS — R748 Abnormal levels of other serum enzymes: Secondary | ICD-10-CM | POA: Diagnosis not present

## 2016-08-15 NOTE — Progress Notes (Signed)
   Subjective:    Patient ID: Chase Carson, male    DOB: 11-13-1970, 46 y.o.   MRN: [id number]  Hypertension  This is a chronic problem. The current episode started more than 1 year ago. Risk factors for coronary artery disease include male gender. Treatments tried: vasotec. There are no compliance problems.    Discuss if he needs sleep study-t  Having ongoing challenge with restless legs syndrome, leg jumps a lot  Daytime fatigue very significant, has severe challenge with snoring, also breathing stops frequently,   Pt working overtime -lower energy level  Blood pressure medicine and blood pressure levels reviewed today with patient. Compliant with blood pressure medicine. States does not miss a dose. No obvious side effects. Blood pressure generally good when checked elsewhere. Watching salt intake.  Prediabetes sig, pt realizes this, has weaned self off sugar  Trying to eat better over all  Restless legs syndrome, states the requip if nytning made the cdtn worse, this is the seond med pt has failed on     Review of Systems No headache, no major weight loss or weight gain, no chest pain no back pain abdominal pain no change in bowel habits complete ROS otherwise negative     Objective:   Physical Exam Obesity present Alert and oriented, vitals reviewed and stable, NAD ENT-TM's and ext canals WNL bilat via otoscopic exam Soft palate, tonsils and post pharynx WNL via oropharyngeal exam Neck-symmetric, no masses; thyroid nonpalpable and nontender Pulmonary-no tachypnea or accessory muscle use; Clear without wheezes via auscultation Card--no abnrml murmurs, rhythm reg and rate WNL Carotid pulses symmetric, without bruits       Assessment & Plan:  Impression 1 hyperlipidemia status uncertain discuss need to check blood work #2 fatigue likely multi-factorial discussed. #3 hypertension decent control discussed maintain same meds #4 restless leg syndrome suboptimal in  control discussed cannot tolerate medications #5 probable sleep apnea. Severe snoring. Many episodes of stopping breathing at night her spouse. Tremendous daytime fatigue. Plan appropriate blood work. Medications refilled. Diet exercise discussed. Sleep study. Rationale discussed.

## 2016-08-16 LAB — BASIC METABOLIC PANEL
BUN / CREAT RATIO: 14 (ref 9–20)
BUN: 10 mg/dL (ref 6–24)
CHLORIDE: 100 mmol/L (ref 96–106)
CO2: 23 mmol/L (ref 18–29)
CREATININE: 0.71 mg/dL — AB (ref 0.76–1.27)
Calcium: 9.6 mg/dL (ref 8.7–10.2)
GFR calc Af Amer: 131 mL/min/{1.73_m2} (ref >59)
GFR calc non Af Amer: 113 mL/min/{1.73_m2} (ref >59)
GLUCOSE: 113 mg/dL — AB (ref 65–99)
Potassium: 4.2 mmol/L (ref 3.5–5.2)
Sodium: 142 mmol/L (ref 134–144)

## 2016-08-16 LAB — CBC WITH DIFFERENTIAL/PLATELET
BASOS ABS: 0.1 10*3/uL (ref 0.0–0.2)
Basos: 1 %
EOS (ABSOLUTE): 0.2 10*3/uL (ref 0.0–0.4)
EOS: 2 %
HEMATOCRIT: 49.2 % (ref 37.5–51.0)
HEMOGLOBIN: 17.4 g/dL (ref 13.0–17.7)
IMMATURE GRANULOCYTES: 0 %
Immature Grans (Abs): 0 10*3/uL (ref 0.0–0.1)
LYMPHS ABS: 3.2 10*3/uL — AB (ref 0.7–3.1)
Lymphs: 25 %
MCH: 30.7 pg (ref 26.6–33.0)
MCHC: 35.4 g/dL (ref 31.5–35.7)
MCV: 87 fL (ref 79–97)
MONOCYTES: 7 %
Monocytes Absolute: 0.9 10*3/uL (ref 0.1–0.9)
NEUTROS PCT: 65 %
Neutrophils Absolute: 8.4 10*3/uL — ABNORMAL HIGH (ref 1.4–7.0)
Platelets: 245 10*3/uL (ref 150–379)
RBC: 5.67 x10E6/uL (ref 4.14–5.80)
RDW: 13.6 % (ref 12.3–15.4)
WBC: 12.8 10*3/uL — AB (ref 3.4–10.8)

## 2016-08-16 LAB — TSH: TSH: 11.18 u[IU]/mL — ABNORMAL HIGH (ref 0.450–4.500)

## 2016-08-16 LAB — HEPATIC FUNCTION PANEL
ALT: 36 IU/L (ref 0–44)
AST: 25 IU/L (ref 0–40)
Albumin: 4.8 g/dL (ref 3.5–5.5)
Alkaline Phosphatase: 86 IU/L (ref 39–117)
Bilirubin Total: 1.6 mg/dL — ABNORMAL HIGH (ref 0.0–1.2)
Bilirubin, Direct: 0.28 mg/dL (ref 0.00–0.40)
TOTAL PROTEIN: 7.1 g/dL (ref 6.0–8.5)

## 2016-08-16 LAB — HEMOGLOBIN A1C
ESTIMATED AVERAGE GLUCOSE: 123 mg/dL
HEMOGLOBIN A1C: 5.9 % — AB (ref 4.8–5.6)

## 2016-08-16 LAB — LIPID PANEL
CHOL/HDL RATIO: 7.1 ratio — AB (ref 0.0–5.0)
Cholesterol, Total: 192 mg/dL (ref 100–199)
HDL: 27 mg/dL — AB (ref >39)
LDL Calculated: 107 mg/dL — ABNORMAL HIGH (ref 0–99)
TRIGLYCERIDES: 289 mg/dL — AB (ref 0–149)
VLDL Cholesterol Cal: 58 mg/dL — ABNORMAL HIGH (ref 5–40)

## 2016-08-16 LAB — TESTOSTERONE: TESTOSTERONE: 249 ng/dL — AB (ref 264–916)

## 2016-08-23 ENCOUNTER — Ambulatory Visit (INDEPENDENT_AMBULATORY_CARE_PROVIDER_SITE_OTHER): Payer: BLUE CROSS/BLUE SHIELD | Admitting: Family Medicine

## 2016-08-23 ENCOUNTER — Encounter: Payer: Self-pay | Admitting: Family Medicine

## 2016-08-23 VITALS — BP 142/86 | Ht 72.0 in | Wt 291.0 lb

## 2016-08-23 DIAGNOSIS — E039 Hypothyroidism, unspecified: Secondary | ICD-10-CM

## 2016-08-23 MED ORDER — LEVOTHYROXINE SODIUM 75 MCG PO TABS: 75.0000 ug | ORAL_TABLET | Freq: Every day | ORAL | 5 refills | Status: DC

## 2016-08-23 NOTE — Patient Instructions (Signed)
Results for orders placed or performed in visit on 08/15/16  TSH  Result Value Ref Range   TSH 11.180 (H) 0.450 - 4.500 uIU/mL  Lipid panel  Result Value Ref Range   Cholesterol, Total 192 100 - 199 mg/dL   Triglycerides 782 (H) 0 - 149 mg/dL   HDL 27 (L) >95 mg/dL   VLDL Cholesterol Cal 58 (H) 5 - 40 mg/dL   LDL Calculated 621 (H) 0 - 99 mg/dL   Chol/HDL Ratio 7.1 (H) 0.0 - 5.0 ratio  Hepatic function panel  Result Value Ref Range   Total Protein 7.1 6.0 - 8.5 g/dL   Albumin 4.8 3.5 - 5.5 g/dL   Bilirubin Total 1.6 (H) 0.0 - 1.2 mg/dL   Bilirubin, Direct 3.08 0.00 - 0.40 mg/dL   Alkaline Phosphatase 86 39 - 117 IU/L   AST 25 0 - 40 IU/L   ALT 36 0 - 44 IU/L  Basic metabolic panel  Result Value Ref Range   Glucose 113 (H) 65 - 99 mg/dL   BUN 10 6 - 24 mg/dL   Creatinine, Ser 6.57 (L) 0.76 - 1.27 mg/dL   GFR calc non Af Amer 113 >59 mL/min/1.73   GFR calc Af Amer 131 >59 mL/min/1.73   BUN/Creatinine Ratio 14 9 - 20   Sodium 142 134 - 144 mmol/L   Potassium 4.2 3.5 - 5.2 mmol/L   Chloride 100 96 - 106 mmol/L   CO2 23 18 - 29 mmol/L   Calcium 9.6 8.7 - 10.2 mg/dL  CBC with Differential/Platelet  Result Value Ref Range   WBC 12.8 (H) 3.4 - 10.8 x10E3/uL   RBC 5.67 4.14 - 5.80 x10E6/uL   Hemoglobin 17.4 13.0 - 17.7 g/dL   Hematocrit 84.6 96.2 - 51.0 %   MCV 87 79 - 97 fL   MCH 30.7 26.6 - 33.0 pg   MCHC 35.4 31.5 - 35.7 g/dL   RDW 95.2 84.1 - 32.4 %   Platelets 245 150 - 379 x10E3/uL   Neutrophils 65 Not Estab. %   Lymphs 25 Not Estab. %   Monocytes 7 Not Estab. %   Eos 2 Not Estab. %   Basos 1 Not Estab. %   Neutrophils Absolute 8.4 (H) 1.4 - 7.0 x10E3/uL   Lymphocytes Absolute 3.2 (H) 0.7 - 3.1 x10E3/uL   Monocytes Absolute 0.9 0.1 - 0.9 x10E3/uL   EOS (ABSOLUTE) 0.2 0.0 - 0.4 x10E3/uL   Basophils Absolute 0.1 0.0 - 0.2 x10E3/uL   Immature Granulocytes 0 Not Estab. %   Immature Grans (Abs) 0.0 0.0 - 0.1 x10E3/uL  Testosterone  Result Value Ref Range   Testosterone 249 (L) 264 - 916 ng/dL  Hemoglobin M0N  Result Value Ref Range   Hgb A1c MFr Bld 5.9 (H) 4.8 - 5.6 %   Est. average glucose Bld gHb Est-mCnc 123 mg/dL    Hypothyroidism Hypothyroidism is a disorder of the thyroid. The thyroid is a large gland that is located in the lower front of the neck. The thyroid releases hormones that control how the body works. With hypothyroidism, the thyroid does not make enough of these hormones. What are the causes? Causes of hypothyroidism may include:  Viral infections.  Pregnancy.  Your own defense system (immune system) attacking your thyroid.  Certain medicines.  Birth defects.  Past radiation treatments to your head or neck.  Past treatment with radioactive iodine.  Past surgical removal of part or all of your thyroid.  Problems with the gland  that is located in the center of your brain (pituitary). What are the signs or symptoms? Signs and symptoms of hypothyroidism may include:  Feeling as though you have no energy (lethargy).  Inability to tolerate cold.  Weight gain that is not explained by a change in diet or exercise habits.  Dry skin.  Coarse hair.  Menstrual irregularity.  Slowing of thought processes.  Constipation.  Sadness or depression. How is this diagnosed? Your health care provider may diagnose hypothyroidism with blood tests and ultrasound tests. How is this treated? Hypothyroidism is treated with medicine that replaces the hormones that your body does not make. After you begin treatment, it may take several weeks for symptoms to go away. Follow these instructions at home:  Take medicines only as directed by your health care provider.  If you start taking any new medicines, tell your health care provider.  Keep all follow-up visits as directed by your health care provider. This is important. As your condition improves, your dosage needs may change. You will need to have blood tests regularly so  that your health care provider can watch your condition. Contact a health care provider if:  Your symptoms do not get better with treatment.  You are taking thyroid replacement medicine and:  You sweat excessively.  You have tremors.  You feel anxious.  You lose weight rapidly.  You cannot tolerate heat.  You have emotional swings.  You have diarrhea.  You feel weak. Get help right away if:  You develop chest pain.  You develop an irregular heartbeat.  You develop a rapid heartbeat. This information is not intended to replace advice given to you by your health care provider. Make sure you discuss any questions you have with your health care provider. Document Released: 05/01/2005 Document Revised: 10/07/2015 Document Reviewed: 09/16/2013 Elsevier Interactive Patient Education  2017 [employer].

## 2016-08-23 NOTE — Progress Notes (Signed)
Subjective:    Patient ID: Chase Carson, male    DOB: 14-Feb-1971, 46 y.o.   MRN: [id number]  HPIFollow up on test results. Pt states no other concerns.   Results for orders placed or performed in visit on 08/15/16  TSH  Result Value Ref Range   TSH 11.180 (H) 0.450 - 4.500 uIU/mL  Lipid panel  Result Value Ref Range   Cholesterol, Total 192 100 - 199 mg/dL   Triglycerides 409 (H) 0 - 149 mg/dL   HDL 27 (L) >81 mg/dL   VLDL Cholesterol Cal 58 (H) 5 - 40 mg/dL   LDL Calculated 191 (H) 0 - 99 mg/dL   Chol/HDL Ratio 7.1 (H) 0.0 - 5.0 ratio  Hepatic function panel  Result Value Ref Range   Total Protein 7.1 6.0 - 8.5 g/dL   Albumin 4.8 3.5 - 5.5 g/dL   Bilirubin Total 1.6 (H) 0.0 - 1.2 mg/dL   Bilirubin, Direct 4.78 0.00 - 0.40 mg/dL   Alkaline Phosphatase 86 39 - 117 IU/L   AST 25 0 - 40 IU/L   ALT 36 0 - 44 IU/L  Basic metabolic panel  Result Value Ref Range   Glucose 113 (H) 65 - 99 mg/dL   BUN 10 6 - 24 mg/dL   Creatinine, Ser 2.95 (L) 0.76 - 1.27 mg/dL   GFR calc non Af Amer 113 >59 mL/min/1.73   GFR calc Af Amer 131 >59 mL/min/1.73   BUN/Creatinine Ratio 14 9 - 20   Sodium 142 134 - 144 mmol/L   Potassium 4.2 3.5 - 5.2 mmol/L   Chloride 100 96 - 106 mmol/L   CO2 23 18 - 29 mmol/L   Calcium 9.6 8.7 - 10.2 mg/dL  CBC with Differential/Platelet  Result Value Ref Range   WBC 12.8 (H) 3.4 - 10.8 x10E3/uL   RBC 5.67 4.14 - 5.80 x10E6/uL   Hemoglobin 17.4 13.0 - 17.7 g/dL   Hematocrit 62.1 30.8 - 51.0 %   MCV 87 79 - 97 fL   MCH 30.7 26.6 - 33.0 pg   MCHC 35.4 31.5 - 35.7 g/dL   RDW 65.7 84.6 - 96.2 %   Platelets 245 150 - 379 x10E3/uL   Neutrophils 65 Not Estab. %   Lymphs 25 Not Estab. %   Monocytes 7 Not Estab. %   Eos 2 Not Estab. %   Basos 1 Not Estab. %   Neutrophils Absolute 8.4 (H) 1.4 - 7.0 x10E3/uL   Lymphocytes Absolute 3.2 (H) 0.7 - 3.1 x10E3/uL   Monocytes Absolute 0.9 0.1 - 0.9 x10E3/uL   EOS (ABSOLUTE) 0.2 0.0 - 0.4 x10E3/uL   Basophils  Absolute 0.1 0.0 - 0.2 x10E3/uL   Immature Granulocytes 0 Not Estab. %   Immature Grans (Abs) 0.0 0.0 - 0.1 x10E3/uL  Testosterone  Result Value Ref Range   Testosterone 249 (L) 264 - 916 ng/dL  Hemoglobin X5M  Result Value Ref Range   Hgb A1c MFr Bld 5.9 (H) 4.8 - 5.6 %   Est. average glucose Bld gHb Est-mCnc 123 mg/dL     Patient states still has substantial fatigue. Tiredness. Slight constipation. Often feels cold. Gaining weight. No energy to do anything after work                                         Review of Systems No headache, no major weight  loss or weight gain, no chest pain no back pain abdominal pain no change in bowel habits complete ROS otherwise negative     Objective:   Physical Exam  Alert vitals stable, NAD. Blood pressure good on repeat. HEENT normal. Lungs clear. Heart regular rate and rhythm. Thyroid nonpalpable      Assessment & Plan:  Impression #1 hypothyroidism discussed at length. Plan initiate thyroid supplement. Rationale discussed. Recheck TSH in several months. Further recommendations based results then [facility]

## 2016-08-25 ENCOUNTER — Encounter: Payer: Self-pay | Admitting: Family Medicine

## 2016-10-05 ENCOUNTER — Other Ambulatory Visit: Payer: Self-pay | Admitting: Family Medicine

## 2016-10-16 DIAGNOSIS — E039 Hypothyroidism, unspecified: Secondary | ICD-10-CM | POA: Diagnosis not present

## 2016-10-17 LAB — TSH: TSH: 9.19 u[IU]/mL — AB (ref 0.450–4.500)

## 2016-10-17 MED ORDER — LEVOTHYROXINE SODIUM 125 MCG PO TABS: 125.0000 ug | ORAL_TABLET | Freq: Every day | ORAL | 0 refills | Status: DC

## 2016-10-17 NOTE — Addendum Note (Signed)
Addended by: Margaretha SheffieldBROWN, Andrika Peraza S on: 10/17/2016 10:30 AM   Modules accepted: Orders

## 2016-10-26 ENCOUNTER — Encounter (HOSPITAL_BASED_OUTPATIENT_CLINIC_OR_DEPARTMENT_OTHER): Payer: Self-pay

## 2016-10-26 DIAGNOSIS — G4733 Obstructive sleep apnea (adult) (pediatric): Secondary | ICD-10-CM

## 2016-10-26 DIAGNOSIS — R5383 Other fatigue: Secondary | ICD-10-CM

## 2016-10-26 DIAGNOSIS — G4710 Hypersomnia, unspecified: Secondary | ICD-10-CM

## 2016-10-26 DIAGNOSIS — R0683 Snoring: Secondary | ICD-10-CM

## 2016-11-10 ENCOUNTER — Ambulatory Visit: Payer: BLUE CROSS/BLUE SHIELD | Attending: Family Medicine | Admitting: Neurology

## 2016-11-10 DIAGNOSIS — G4719 Other hypersomnia: Secondary | ICD-10-CM | POA: Insufficient documentation

## 2016-11-10 DIAGNOSIS — R5383 Other fatigue: Secondary | ICD-10-CM | POA: Diagnosis not present

## 2016-11-10 DIAGNOSIS — G4710 Hypersomnia, unspecified: Secondary | ICD-10-CM

## 2016-11-10 DIAGNOSIS — G4761 Periodic limb movement disorder: Secondary | ICD-10-CM | POA: Diagnosis not present

## 2016-11-10 DIAGNOSIS — R0683 Snoring: Secondary | ICD-10-CM

## 2016-11-10 DIAGNOSIS — Z79899 Other long term (current) drug therapy: Secondary | ICD-10-CM | POA: Insufficient documentation

## 2016-11-10 DIAGNOSIS — G4733 Obstructive sleep apnea (adult) (pediatric): Secondary | ICD-10-CM | POA: Diagnosis not present

## 2016-11-11 NOTE — Procedures (Signed)
[place] A. Merlene Laughter, MD     [email or web address]             NOCTURNAL POLYSOMNOGRAPHY   LOCATION: [facility]  Patient Name: Chase Carson, Nest Date: 11/10/2016 Gender: Male D.O.B: 10/21/1970 Age (years): 46 Referring Provider: Sallee Lange Height (inches): 72 Interpreting Physician: Phillips Odor MD, ABSM Weight (lbs): 283 RPSGT: Peak, Robert BMI: 38 MRN: [id number] Neck Size: 18.00 CLINICAL INFORMATION The patient is referred for a split night study with BPAP.   MEDICATIONS Medications self-administered by patient taken the night of the study : N/A  Current Outpatient Prescriptions:  .  Cholecalciferol (VITAMIN D3) 2000 UNITS TABS, Take by mouth., Disp: , Rfl:  .  enalapril (VASOTEC) 10 MG tablet, TAKE ONE TABLET BY MOUTH ONCE DAILY, Disp: 30 tablet, Rfl: 5 .  FOLIC ACID PO, Take 546 mcg by mouth daily. , Disp: , Rfl:  .  levothyroxine (SYNTHROID, LEVOTHROID) 125 MCG tablet, Take 1 tablet (125 mcg total) by mouth daily., Disp: 90 tablet, Rfl: 0 .  MULTI GINSENG 1000 MG CAPS, Take by mouth., Disp: , Rfl:  .  rOPINIRole (REQUIP) 0.5 MG tablet, Take 1 tablet (0.5 mg total) by mouth at bedtime., Disp: 30 tablet, Rfl: 5 .  tadalafil (CIALIS) 20 MG tablet, Take 1 tablet 2 hours before sex prn, Disp: 8 tablet, Rfl: PRN   SLEEP STUDY TECHNIQUE As per the AASM Manual for the Scoring of Sleep and Associated Events v2.3 (April 2016) with a hypopnea requiring 4% desaturations.  The channels recorded and monitored were frontal, central and occipital EEG, electrooculogram (EOG), submentalis EMG (chin), nasal and oral airflow, thoracic and abdominal wall motion, anterior tibialis EMG, snore microphone, electrocardiogram, and pulse oximetry. Bi-level positive airway pressure (BiPAP) was initiated when the patient met split night criteria and was titrated according to treat sleep-disordered breathing.  RESPIRATORY PARAMETERS Diagnostic  Total AHI  (/hr): 41.8 RDI (/hr): 48.1 OA Index (/hr): 4.2 CA Index (/hr): 1.7 REM AHI (/hr): N/A NREM AHI (/hr): 41.8 Supine AHI (/hr): 41.8 Non-supine AHI (/hr): N/A Min O2 Sat (%): 78.00 Mean O2 (%): 92.26 Time below 88% (min): 5.3     Titration: Initially tried on CPAP but could not breath against the pressure. BiPAP was then started which he tolerated.   Optimal IPAP Pressure (cm): 9 Optimal EPAP Pressure (cm): 4 AHI at Optimal Pressure (/hr): 6.6 Min O2 at Optimal Pressure (%): 92.0 Sleep % at Optimal (%): 99 Supine % at Optimal (%): 100         SLEEP ARCHITECTURE The study was initiated at 8:54:51 PM and terminated at 4:22:59 AM. The total recorded time was 448.1 minutes. EEG confirmed total sleep time was 384.5 minutes yielding a sleep efficiency of 85.8%. Sleep onset after lights out was 2.6 minutes with a REM latency of 237.5 minutes. The patient spent 14.56% of the night in stage N1 sleep, 72.43% in stage N2 sleep, 0.00% in stage N3 and 13.00% in REM. Wake after sleep onset (WASO) was 61.0 minutes. The Arousal Index was 25.9/hour.  LEG MOVEMENT DATA The total Periodic Limb Movements of Sleep (PLMS) were 99. The PLMS index was 15.45 .  CARDIAC DATA The 2 lead EKG demonstrated sinus rhythm. The mean heart rate was 71.23 beats per minute. Other EKG findings include: None.   IMPRESSIONS - Severe obstructive sleep apnea occurred during the diagnostic portion of the study (AHI = 41.8 /hour). He did not tolerate CPAP but did well with CPAP. Optimal BiPAP  9/4 are  selected for this patient ( 9 / 4 cm of water).  - Moderate periodic limb movements of sleep occurred during the study.    Delano Metz, MD Diplomate, American Board of Sleep Medicine.   ELECTRONICALLY SIGNED ON:  11/11/2016, 8:38 PM [place] PH: [phone]   FX: [phone] [place]

## 2017-01-02 DIAGNOSIS — E039 Hypothyroidism, unspecified: Secondary | ICD-10-CM | POA: Diagnosis not present

## 2017-01-03 LAB — TSH: TSH: 1.62 u[IU]/mL (ref 0.450–4.500)

## 2017-01-09 ENCOUNTER — Telehealth: Payer: Self-pay | Admitting: *Deleted

## 2017-01-09 MED ORDER — LEVOTHYROXINE SODIUM 125 MCG PO TABS: 125.0000 ug | ORAL_TABLET | Freq: Every day | ORAL | 0 refills | Status: DC

## 2017-01-09 NOTE — Telephone Encounter (Signed)
Patient would like results of sleep study that was done in June (results under encounter)

## 2017-01-09 NOTE — Addendum Note (Signed)
Addended by: Margaretha Sheffield on: 01/09/2017 02:52 PM   Modules accepted: Orders

## 2017-01-10 NOTE — Telephone Encounter (Signed)
testshowed fairly severe obstructive sleep apnea, chris did not handle the reg cpap settings well so dr Peggye [place]quah rec a bipap machine with setting of 9 and 4, plz rx , I'll sign, and have pt get

## 2017-01-11 NOTE — Telephone Encounter (Signed)
Patient is aware we will send to [facility] to fill.

## 2017-01-24 DIAGNOSIS — G4733 Obstructive sleep apnea (adult) (pediatric): Secondary | ICD-10-CM | POA: Diagnosis not present

## 2017-01-26 ENCOUNTER — Telehealth: Payer: Self-pay | Admitting: Family Medicine

## 2017-01-26 NOTE — Telephone Encounter (Signed)
change to 7 and 3

## 2017-01-26 NOTE — Telephone Encounter (Signed)
Pt is wanting the Dr to look over his cpap settings. Pt states that startup is set at 8 and it is too much for the pt. Pt states that it blows the mask off of his face and makes his ears pop. Pt stated that at a 7 it was significantly better. Pt is needing a prescription faxed to [facility] so that his settings can be changed. Please advise.

## 2017-01-26 NOTE — Telephone Encounter (Signed)
Please clarify   Try to switch to Auto CPAP or change setting from 8 to 7  Please advise

## 2017-01-31 ENCOUNTER — Telehealth: Payer: Self-pay | Admitting: Family Medicine

## 2017-01-31 NOTE — Telephone Encounter (Signed)
Pt called again  Needs pressure settings changed on his BiPAP machine  Needs his RAMP UP set to 7 and 3  Please send new order to [facility]  Please call pt when done

## 2017-01-31 NOTE — Telephone Encounter (Signed)
Let's do 

## 2017-02-01 NOTE — Telephone Encounter (Signed)
Order written for pressure change 7 over 4 cwp Sent to Dr. Brett Canales for signature

## 2017-02-01 NOTE — Telephone Encounter (Signed)
LMOM for Respiratory @ [facility] ? What to send with order for pressure change request

## 2017-02-02 NOTE — Telephone Encounter (Signed)
Order faxed to [facility].  

## 2017-02-14 ENCOUNTER — Ambulatory Visit (INDEPENDENT_AMBULATORY_CARE_PROVIDER_SITE_OTHER): Payer: BLUE CROSS/BLUE SHIELD | Admitting: Family Medicine

## 2017-02-14 ENCOUNTER — Encounter: Payer: Self-pay | Admitting: Family Medicine

## 2017-02-14 VITALS — BP 140/94 | Ht 72.0 in | Wt 282.0 lb

## 2017-02-14 DIAGNOSIS — I10 Essential (primary) hypertension: Secondary | ICD-10-CM

## 2017-02-14 DIAGNOSIS — R7303 Prediabetes: Secondary | ICD-10-CM

## 2017-02-14 DIAGNOSIS — G4733 Obstructive sleep apnea (adult) (pediatric): Secondary | ICD-10-CM | POA: Diagnosis not present

## 2017-02-14 DIAGNOSIS — E039 Hypothyroidism, unspecified: Secondary | ICD-10-CM | POA: Diagnosis not present

## 2017-02-14 DIAGNOSIS — E781 Pure hyperglyceridemia: Secondary | ICD-10-CM

## 2017-02-14 DIAGNOSIS — R739 Hyperglycemia, unspecified: Secondary | ICD-10-CM

## 2017-02-14 DIAGNOSIS — G2581 Restless legs syndrome: Secondary | ICD-10-CM

## 2017-02-14 LAB — POCT GLYCOSYLATED HEMOGLOBIN (HGB A1C): Hemoglobin A1C: 5

## 2017-02-14 MED ORDER — ENALAPRIL MALEATE 10 MG PO TABS: 10.0000 mg | ORAL_TABLET | Freq: Every day | ORAL | 5 refills | Status: DC

## 2017-02-14 MED ORDER — ROPINIROLE HCL 0.5 MG PO TABS: 0.5000 mg | ORAL_TABLET | Freq: Every day | ORAL | 5 refills | Status: DC

## 2017-02-14 MED ORDER — SILDENAFIL CITRATE 100 MG PO TABS: ORAL_TABLET | ORAL | 11 refills | Status: DC

## 2017-02-14 MED ORDER — CIPROFLOXACIN HCL 500 MG PO TABS: 500.0000 mg | ORAL_TABLET | Freq: Two times a day (BID) | ORAL | 0 refills | Status: AC

## 2017-02-14 NOTE — Progress Notes (Signed)
   Subjective:    Patient ID: Chase Carson, male    DOB: 08-06-1970, 46 y.o.   MRN: [id number]  Hypertension  This is a recurrent problem. The current episode started more than 1 year ago.  States he thinks his bps are doing good, he states he has not seen any floaters and has not felt thumping in head from the high bp.He takes enalapril 10 mg once daily. He states he eats as healthy as he can. He does not get enough exercise.  Patient also here for pre diabetes check with a A1c of  Review of Systems Results for orders placed or performed in visit on 02/14/17  POCT glycosylated hemoglobin (Hb A1C)  Result Value Ref Range   Hemoglobin A1C 5.0    Pt now on BiPap, pt had adjusted the settings,  Pt had challenge with the BiPap devuice, getting more used to it, now don to ten events per hr, wears it more loosely now and hlping   Blood pressure medicine and blood pressure levels reviewed today with patient. Compliant with blood pressure medicine. States does not miss a dose. No obvious side effects. Blood pressure generally good when checked elsewhere. Watching salt intake.   Thyroid stable, last b w looked good,  Prediabetes better, has ut own junk food, and handling better   Ongoing challenges with erectile dysfunction. At this time would like to try Viagra 100 mg   Did not snore so well   No headache, no major weight loss or weight gain, no chest pain no back pain abdominal pain no change in bowel habits complete ROS otherwise negative     Objective:   Physical Exam  Alert and oriented, vitals reviewed and stable, NAD ENT-TM's and ext canals WNL bilat via otoscopic exam/some nasal congestion frontal tenderness. Soft palate, tonsils and post pharynx WNL via oropharyngeal exam Neck-symmetric, no masses; thyroid nonpalpable and nontender Pulmonary-no tachypnea or accessory muscle use; Clear without wheezes via auscultation Card--no abnrml murmurs, rhythm reg and rate  WNL Carotid pulses symmetric, without bruits       Assessment & Plan:  Impression 1 rhinosinusitis discussed. Press on and cover with antibiotics  #2 chronic sleep apnea starting to use BiPAP tolerating fairly well appears to be helping.  #3 hypertension good control discussed maintain same  #4 hypothyroidism good control discussed maintain same  #5 erectile dysfunction. Patient like to try Viagra 100 we'll switch to this  Patient declines flu shot

## 2017-02-23 DIAGNOSIS — G4733 Obstructive sleep apnea (adult) (pediatric): Secondary | ICD-10-CM | POA: Diagnosis not present

## 2017-03-05 ENCOUNTER — Ambulatory Visit (INDEPENDENT_AMBULATORY_CARE_PROVIDER_SITE_OTHER): Payer: BLUE CROSS/BLUE SHIELD | Admitting: Nurse Practitioner

## 2017-03-05 ENCOUNTER — Encounter: Payer: Self-pay | Admitting: Nurse Practitioner

## 2017-03-05 VITALS — Temp 98.9°F | Ht 72.0 in | Wt 282.4 lb

## 2017-03-05 DIAGNOSIS — L03116 Cellulitis of left lower limb: Secondary | ICD-10-CM | POA: Diagnosis not present

## 2017-03-05 DIAGNOSIS — Z23 Encounter for immunization: Secondary | ICD-10-CM

## 2017-03-05 DIAGNOSIS — L02416 Cutaneous abscess of left lower limb: Secondary | ICD-10-CM | POA: Diagnosis not present

## 2017-03-05 MED ORDER — DOXYCYCLINE HYCLATE 100 MG PO TABS: 100.0000 mg | ORAL_TABLET | Freq: Two times a day (BID) | ORAL | 0 refills | Status: DC

## 2017-03-05 MED ORDER — TRIAMCINOLONE ACETONIDE 0.1 % EX CREA: 1.0000 "application " | TOPICAL_CREAM | Freq: Two times a day (BID) | CUTANEOUS | 0 refills | Status: DC

## 2017-03-05 NOTE — Progress Notes (Signed)
Subjective: Presents for complaints of possible "spider bite" that occurred around 10/13 or so.  Occurred at home.  Does not remember a specific bite.  Did not see a spider at the time.  States the area came up fairly quickly.  Looked like a large "pus bump".  Started draining about 4 days ago, dark bloody/green fluid.  States it is much improved over the past several days.  No fever.  No headache.  No nausea vomiting or diarrhea.  No muscle spasms.  Area has began itching today.  No known contacts.  No other rash.  Objective:   Temp 98.9 F (37.2 C) (Oral)   Ht 6' (1.829 m)   Wt 282 lb 6.4 oz (128.1 kg)   BMI 38.30 kg/m  NAD.  Alert, oriented.  Lungs clear.  Heart regular rate and rhythm.  Approximately 6-8 cm area of erythema fairly well-defined noted on the left lower leg.  A firm slightly raised area approximately 6 cm noted in the center with a small open area noted in the very center.  A few faint streaks of pink noted.  Area is mildly tender to palpation.  Minimal edema of the lower leg.  Assessment:  Cellulitis and abscess of left leg - Plan: Td vaccine greater than or equal to 7yo preservative free IM    Plan:   Meds ordered this encounter  Medications  . doxycycline (VIBRA-TABS) 100 MG tablet    Sig: Take 1 tablet (100 mg total) by mouth 2 (two) times daily.    Dispense:  20 tablet    Refill:  0    Order Specific Question:   Supervising Provider    Answer:   Merlyn AlbertLUKING, WILLIAM S [[id number]]  . triamcinolone cream (KENALOG) 0.1 %    Sig: Apply 1 application topically 2 (two) times daily. Prn rash; use up to 2 weeks    Dispense:  30 g    Refill:  0    Order Specific Question:   Supervising Provider    Answer:   Merlyn AlbertLUKING, WILLIAM S [[id number]]   Patient just completed a course of Cipro for another issue.  Start doxycycline as directed.  Warm compresses.  Antihistamines as directed for itching.  May apply triamcinolone cream to any closed areas.  Warning signs reviewed.  Expect continued  gradual resolution, call back if symptoms worsen or persist.  Explained that this is most likely MRSA.

## 2017-03-05 NOTE — Patient Instructions (Addendum)
claritin or allegra in the morning Benadryl at night Zantac or Pepcid    Skin Abscess A skin abscess is an infected area on or under your skin that contains pus and other material. An abscess can happen almost anywhere on your body. Some abscesses break open (rupture) on their own. Most continue to get worse unless they are treated. The infection can spread deeper into the body and into your blood, which can make you feel sick. Treatment usually involves draining the abscess. Follow these instructions at home: Abscess Care  If you have an abscess that has not drained, place a warm, clean, wet washcloth over the abscess several times a day. Do this as told by your doctor.  Follow instructions from your doctor about how to take care of your abscess. Make sure you: ? Cover the abscess with a bandage (dressing). ? Change your bandage or gauze as told by your doctor. ? Wash your hands with soap and water before you change the bandage or gauze. If you cannot use soap and water, use hand sanitizer.  Check your abscess every day for signs that the infection is getting worse. Check for: ? More redness, swelling, or pain. ? More fluid or blood. ? Warmth. ? More pus or a bad smell. Medicines   Take over-the-counter and prescription medicines only as told by your doctor.  If you were prescribed an antibiotic medicine, take it as told by your doctor. Do not stop taking the antibiotic even if you start to feel better. General instructions  To avoid spreading the infection: ? Do not share personal care items, towels, or hot tubs with others. ? Avoid making skin-to-skin contact with other people.  Keep all follow-up visits as told by your doctor. This is important. Contact a doctor if:  You have more redness, swelling, or pain around your abscess.  You have more fluid or blood coming from your abscess.  Your abscess feels warm when you touch it.  You have more pus or a bad smell coming  from your abscess.  You have a fever.  Your muscles ache.  You have chills.  You feel sick. Get help right away if:  You have very bad (severe) pain.  You see red streaks on your skin spreading away from the abscess. This information is not intended to replace advice given to you by your health care provider. Make sure you discuss any questions you have with your health care provider. Document Released: 10/18/2007 Document Revised: 12/26/2015 Document Reviewed: 03/10/2015 Elsevier Interactive Patient Education  [employer]2018 [employer]

## 2017-03-26 DIAGNOSIS — G4733 Obstructive sleep apnea (adult) (pediatric): Secondary | ICD-10-CM | POA: Diagnosis not present

## 2017-04-01 ENCOUNTER — Other Ambulatory Visit: Payer: Self-pay | Admitting: Family Medicine

## 2017-04-25 DIAGNOSIS — G4733 Obstructive sleep apnea (adult) (pediatric): Secondary | ICD-10-CM | POA: Diagnosis not present

## 2017-05-26 DIAGNOSIS — G4733 Obstructive sleep apnea (adult) (pediatric): Secondary | ICD-10-CM | POA: Diagnosis not present

## 2017-06-14 ENCOUNTER — Telehealth: Payer: Self-pay | Admitting: Family Medicine

## 2017-06-14 DIAGNOSIS — E781 Pure hyperglyceridemia: Secondary | ICD-10-CM

## 2017-06-14 DIAGNOSIS — R7303 Prediabetes: Secondary | ICD-10-CM

## 2017-06-14 DIAGNOSIS — I10 Essential (primary) hypertension: Secondary | ICD-10-CM

## 2017-06-14 DIAGNOSIS — R748 Abnormal levels of other serum enzymes: Secondary | ICD-10-CM

## 2017-06-14 DIAGNOSIS — E039 Hypothyroidism, unspecified: Secondary | ICD-10-CM

## 2017-06-14 NOTE — Telephone Encounter (Signed)
Lip liv m7 tsh a1c

## 2017-06-14 NOTE — Telephone Encounter (Signed)
Patient has an appointment with Dr. Brett CanalesSteve on 08/20/17.  He is requesting orders for labs.

## 2017-06-14 NOTE — Telephone Encounter (Signed)
Blood work ordered in Epic. Patient notified. 

## 2017-06-14 NOTE — Telephone Encounter (Signed)
Patient had labs 08/2016 including: Lipid, Liver, Met 7, TSH, HgbA1c, Testosterone and CBC

## 2017-06-26 DIAGNOSIS — G4733 Obstructive sleep apnea (adult) (pediatric): Secondary | ICD-10-CM | POA: Diagnosis not present

## 2017-07-24 DIAGNOSIS — G4733 Obstructive sleep apnea (adult) (pediatric): Secondary | ICD-10-CM | POA: Diagnosis not present

## 2017-08-15 ENCOUNTER — Ambulatory Visit: Payer: BLUE CROSS/BLUE SHIELD | Admitting: Family Medicine

## 2017-08-17 DIAGNOSIS — I10 Essential (primary) hypertension: Secondary | ICD-10-CM | POA: Diagnosis not present

## 2017-08-17 DIAGNOSIS — R748 Abnormal levels of other serum enzymes: Secondary | ICD-10-CM | POA: Diagnosis not present

## 2017-08-17 DIAGNOSIS — R7303 Prediabetes: Secondary | ICD-10-CM | POA: Diagnosis not present

## 2017-08-17 DIAGNOSIS — E039 Hypothyroidism, unspecified: Secondary | ICD-10-CM | POA: Diagnosis not present

## 2017-08-17 DIAGNOSIS — E781 Pure hyperglyceridemia: Secondary | ICD-10-CM | POA: Diagnosis not present

## 2017-08-18 LAB — HEPATIC FUNCTION PANEL
ALT: 42 IU/L (ref 0–44)
AST: 29 IU/L (ref 0–40)
Albumin: 4.7 g/dL (ref 3.5–5.5)
Alkaline Phosphatase: 89 IU/L (ref 39–117)
Bilirubin Total: 0.9 mg/dL (ref 0.0–1.2)
Bilirubin, Direct: 0.21 mg/dL (ref 0.00–0.40)
Total Protein: 7.3 g/dL (ref 6.0–8.5)

## 2017-08-18 LAB — BASIC METABOLIC PANEL
BUN/Creatinine Ratio: 13 (ref 9–20)
BUN: 10 mg/dL (ref 6–24)
CO2: 25 mmol/L (ref 20–29)
CREATININE: 0.79 mg/dL (ref 0.76–1.27)
Calcium: 9.8 mg/dL (ref 8.7–10.2)
Chloride: 99 mmol/L (ref 96–106)
GFR calc Af Amer: 124 mL/min/{1.73_m2} (ref >59)
GFR calc non Af Amer: 108 mL/min/{1.73_m2} (ref >59)
Glucose: 114 mg/dL — ABNORMAL HIGH (ref 65–99)
Potassium: 4.6 mmol/L (ref 3.5–5.2)
SODIUM: 142 mmol/L (ref 134–144)

## 2017-08-18 LAB — LIPID PANEL
Chol/HDL Ratio: 7.3 ratio — ABNORMAL HIGH (ref 0.0–5.0)
Cholesterol, Total: 190 mg/dL (ref 100–199)
HDL: 26 mg/dL — ABNORMAL LOW
Triglycerides: 420 mg/dL — ABNORMAL HIGH (ref 0–149)

## 2017-08-18 LAB — HEMOGLOBIN A1C
Est. average glucose Bld gHb Est-mCnc: 117 mg/dL
Hgb A1c MFr Bld: 5.7 % — ABNORMAL HIGH (ref 4.8–5.6)

## 2017-08-18 LAB — TSH: TSH: 5.21 u[IU]/mL — ABNORMAL HIGH (ref 0.450–4.500)

## 2017-08-20 ENCOUNTER — Ambulatory Visit: Payer: BLUE CROSS/BLUE SHIELD | Admitting: Family Medicine

## 2017-08-20 ENCOUNTER — Encounter: Payer: Self-pay | Admitting: Family Medicine

## 2017-08-20 VITALS — BP 152/100 | Temp 99.8°F | Ht 72.0 in | Wt 280.0 lb

## 2017-08-20 DIAGNOSIS — G4733 Obstructive sleep apnea (adult) (pediatric): Secondary | ICD-10-CM | POA: Diagnosis not present

## 2017-08-20 DIAGNOSIS — R7303 Prediabetes: Secondary | ICD-10-CM | POA: Diagnosis not present

## 2017-08-20 DIAGNOSIS — I10 Essential (primary) hypertension: Secondary | ICD-10-CM

## 2017-08-20 DIAGNOSIS — G2581 Restless legs syndrome: Secondary | ICD-10-CM | POA: Diagnosis not present

## 2017-08-20 MED ORDER — ROPINIROLE HCL 0.5 MG PO TABS: 0.5000 mg | ORAL_TABLET | Freq: Every day | ORAL | 5 refills | Status: DC

## 2017-08-20 MED ORDER — ROPINIROLE HCL 1 MG PO TABS: 1.0000 mg | ORAL_TABLET | Freq: Every day | ORAL | 5 refills | Status: DC

## 2017-08-20 MED ORDER — CEFPROZIL 500 MG PO TABS: 500.0000 mg | ORAL_TABLET | Freq: Two times a day (BID) | ORAL | 0 refills | Status: DC

## 2017-08-20 MED ORDER — ENALAPRIL MALEATE 10 MG PO TABS: 10.0000 mg | ORAL_TABLET | Freq: Every day | ORAL | 5 refills | Status: DC

## 2017-08-20 MED ORDER — LEVOTHYROXINE SODIUM 150 MCG PO TABS: 150.0000 ug | ORAL_TABLET | Freq: Every day | ORAL | 1 refills | Status: DC

## 2017-08-20 MED ORDER — ENALAPRIL MALEATE 20 MG PO TABS: 20.0000 mg | ORAL_TABLET | Freq: Every day | ORAL | 5 refills | Status: DC

## 2017-08-20 NOTE — Progress Notes (Signed)
Subjective:    Patient ID: Chase OuchChristopher E Carson, male    DOB: 1970-08-07, 47 y.o.   MRN: [id number]  Hypertension  This is a chronic problem. Treatments tried: enalapril. Compliance problems include exercise.    Cough, congestion, sinus pressure, achy. Started 3 days ago.pos productive, low gr temp. Pos frontal pressure and congestion, din erney   Blood pressure medicine and blood pressure levels reviewed today with patient. Compliant with blood pressure medicine. States does not miss a dose. No obvious side effects. Blood pressure generally good when checked elsewhere. Watching salt intake.  Results for orders placed or performed in visit on 06/14/17  Lipid panel  Result Value Ref Range   Cholesterol, Total 190 100 - 199 mg/dL   Triglycerides 782420 (H) 0 - 149 mg/dL   HDL 26 (L) >95>39 mg/dL   VLDL Cholesterol Cal Comment 5 - 40 mg/dL   LDL Calculated Comment 0 - 99 mg/dL   Chol/HDL Ratio 7.3 (H) 0.0 - 5.0 ratio  Hepatic function panel  Result Value Ref Range   Total Protein 7.3 6.0 - 8.5 g/dL   Albumin 4.7 3.5 - 5.5 g/dL   Bilirubin Total 0.9 0.0 - 1.2 mg/dL   Bilirubin, Direct 6.210.21 0.00 - 0.40 mg/dL   Alkaline Phosphatase 89 39 - 117 IU/L   AST 29 0 - 40 IU/L   ALT 42 0 - 44 IU/L  Basic metabolic panel  Result Value Ref Range   Glucose 114 (H) 65 - 99 mg/dL   BUN 10 6 - 24 mg/dL   Creatinine, Ser 3.080.79 0.76 - 1.27 mg/dL   GFR calc non Af Amer 108 >59 mL/min/1.73   GFR calc Af Amer 124 >59 mL/min/1.73   BUN/Creatinine Ratio 13 9 - 20   Sodium 142 134 - 144 mmol/L   Potassium 4.6 3.5 - 5.2 mmol/L   Chloride 99 96 - 106 mmol/L   CO2 25 20 - 29 mmol/L   Calcium 9.8 8.7 - 10.2 mg/dL  TSH  Result Value Ref Range   TSH 5.210 (H) 0.450 - 4.500 uIU/mL  Hemoglobin A1c  Result Value Ref Range   Hgb A1c MFr Bld 5.7 (H) 4.8 - 5.6 %   Est. average glucose Bld gHb Est-mCnc 117 mg/dL     Thyroid.  Compliant with thyroid supplement.  Some fatigue noted.  Just had blood work in this  regard.  Restless leg syndrome states current dose of medication not helping.  Significant symptoms each night   Review of Systems No headache, no major weight loss or weight gain, no chest pain no back pain abdominal pain no change in bowel habits complete ROS otherwise negative     Objective:   Physical Exam  Alert and oriented, vitals reviewed and stable, NAD ENT-TM's and ext canals significant nasal congestion frontal tenderness WNL bilat via otoscopic exam Soft palate, tonsils and post pharynx WNL via oropharyngeal exam Neck-symmetric, no masses; thyroid nonpalpable and nontender Pulmonary-no tachypnea or accessory muscle use; Clear without wheezes via auscultation Card--no abnrml murmurs, rhythm reg and rate WNL Carotid pulses symmetric, without bruits       Assessment & Plan:  Impression 1 hypertension control suboptimal.  Discussed.  Will increase medicine  2.  Restless leg syndrome.  Control suboptimal.  Discussed.  Will increase dose of Requip  3.  Rhinosinusitis will treat  4.  Hypothyroidism.  TSH elevated will increase dose of meds and recheck again in 6 months follow-up  5.  Obstructive sleep apnea  using his device faithfully  6.  Prediabetes A1c stable discussed  Diet exercise discussed medications refilled recheck in 6 months

## 2017-08-24 DIAGNOSIS — G4733 Obstructive sleep apnea (adult) (pediatric): Secondary | ICD-10-CM | POA: Diagnosis not present

## 2017-09-23 DIAGNOSIS — G4733 Obstructive sleep apnea (adult) (pediatric): Secondary | ICD-10-CM | POA: Diagnosis not present

## 2017-10-24 DIAGNOSIS — G4733 Obstructive sleep apnea (adult) (pediatric): Secondary | ICD-10-CM | POA: Diagnosis not present

## 2017-11-23 DIAGNOSIS — G4733 Obstructive sleep apnea (adult) (pediatric): Secondary | ICD-10-CM | POA: Diagnosis not present

## 2017-12-24 DIAGNOSIS — G4733 Obstructive sleep apnea (adult) (pediatric): Secondary | ICD-10-CM | POA: Diagnosis not present

## 2018-01-01 ENCOUNTER — Telehealth: Payer: Self-pay | Admitting: Family Medicine

## 2018-01-01 DIAGNOSIS — R7303 Prediabetes: Secondary | ICD-10-CM

## 2018-01-01 DIAGNOSIS — E039 Hypothyroidism, unspecified: Secondary | ICD-10-CM

## 2018-01-01 DIAGNOSIS — Z79899 Other long term (current) drug therapy: Secondary | ICD-10-CM

## 2018-01-01 DIAGNOSIS — I10 Essential (primary) hypertension: Secondary | ICD-10-CM

## 2018-01-01 DIAGNOSIS — E781 Pure hyperglyceridemia: Secondary | ICD-10-CM

## 2018-01-01 NOTE — Telephone Encounter (Signed)
Last labs 08/2017  Lipid, Liver, Met 7, TSH, and HgbA1c

## 2018-01-01 NOTE — Telephone Encounter (Signed)
tsh lip liv glu A1c

## 2018-01-01 NOTE — Telephone Encounter (Signed)
Orders ready. Pt notified.  

## 2018-01-01 NOTE — Telephone Encounter (Signed)
Patient has appt 02-04-18 for medication refill and he needs labwork prior to appt.  Would like an order put in for [facility].  Leave msg at home # [phone] when order is in.

## 2018-01-24 DIAGNOSIS — G4733 Obstructive sleep apnea (adult) (pediatric): Secondary | ICD-10-CM | POA: Diagnosis not present

## 2018-02-01 DIAGNOSIS — Z79899 Other long term (current) drug therapy: Secondary | ICD-10-CM | POA: Diagnosis not present

## 2018-02-01 DIAGNOSIS — E039 Hypothyroidism, unspecified: Secondary | ICD-10-CM | POA: Diagnosis not present

## 2018-02-01 DIAGNOSIS — R7303 Prediabetes: Secondary | ICD-10-CM | POA: Diagnosis not present

## 2018-02-01 DIAGNOSIS — E781 Pure hyperglyceridemia: Secondary | ICD-10-CM | POA: Diagnosis not present

## 2018-02-02 LAB — TSH: TSH: 0.283 u[IU]/mL — ABNORMAL LOW (ref 0.450–4.500)

## 2018-02-02 LAB — HEPATIC FUNCTION PANEL
ALK PHOS: 96 IU/L (ref 39–117)
ALT: 47 IU/L — AB (ref 0–44)
AST: 32 IU/L (ref 0–40)
Albumin: 4.7 g/dL (ref 3.5–5.5)
Bilirubin Total: 1.1 mg/dL (ref 0.0–1.2)
Bilirubin, Direct: 0.25 mg/dL (ref 0.00–0.40)
TOTAL PROTEIN: 7 g/dL (ref 6.0–8.5)

## 2018-02-02 LAB — HEMOGLOBIN A1C
Est. average glucose Bld gHb Est-mCnc: 114 mg/dL
HEMOGLOBIN A1C: 5.6 % (ref 4.8–5.6)

## 2018-02-02 LAB — LIPID PANEL
Chol/HDL Ratio: 7 ratio — ABNORMAL HIGH (ref 0.0–5.0)
Cholesterol, Total: 169 mg/dL (ref 100–199)
HDL: 24 mg/dL — AB (ref >39)
LDL Calculated: 90 mg/dL (ref 0–99)
TRIGLYCERIDES: 274 mg/dL — AB (ref 0–149)
VLDL CHOLESTEROL CAL: 55 mg/dL — AB (ref 5–40)

## 2018-02-02 LAB — GLUCOSE, RANDOM: GLUCOSE: 99 mg/dL (ref 65–99)

## 2018-02-04 ENCOUNTER — Ambulatory Visit: Payer: BLUE CROSS/BLUE SHIELD | Admitting: Family Medicine

## 2018-02-04 ENCOUNTER — Encounter: Payer: Self-pay | Admitting: Family Medicine

## 2018-02-04 VITALS — BP 142/96 | Ht 72.0 in | Wt 276.2 lb

## 2018-02-04 DIAGNOSIS — E039 Hypothyroidism, unspecified: Secondary | ICD-10-CM

## 2018-02-04 DIAGNOSIS — R7303 Prediabetes: Secondary | ICD-10-CM | POA: Diagnosis not present

## 2018-02-04 DIAGNOSIS — L80 Vitiligo: Secondary | ICD-10-CM

## 2018-02-04 DIAGNOSIS — G4733 Obstructive sleep apnea (adult) (pediatric): Secondary | ICD-10-CM

## 2018-02-04 DIAGNOSIS — N529 Male erectile dysfunction, unspecified: Secondary | ICD-10-CM

## 2018-02-04 DIAGNOSIS — E781 Pure hyperglyceridemia: Secondary | ICD-10-CM | POA: Diagnosis not present

## 2018-02-04 DIAGNOSIS — G2581 Restless legs syndrome: Secondary | ICD-10-CM | POA: Diagnosis not present

## 2018-02-04 DIAGNOSIS — R748 Abnormal levels of other serum enzymes: Secondary | ICD-10-CM

## 2018-02-04 MED ORDER — ENALAPRIL MALEATE 20 MG PO TABS: 20.0000 mg | ORAL_TABLET | Freq: Every day | ORAL | 1 refills | Status: DC

## 2018-02-04 MED ORDER — LEVOTHYROXINE SODIUM 137 MCG PO TABS: ORAL_TABLET | ORAL | 1 refills | Status: DC

## 2018-02-04 NOTE — Progress Notes (Signed)
Subjective:    Patient ID: Chase Carson, male    DOB: 04/14/1971, 47 y.o.   MRN: [id number] Patient arrives with tremendous number of issues HPI Pt here today for medication refill. Pt needs refill on Enalapril, ginseng, and synthroid.   Thyroid, compliant with medication, no obvious side effects.  Blood work done to assess.  Results for orders placed or performed in visit on 01/01/18  Lipid panel  Result Value Ref Range   Cholesterol, Total 169 100 - 199 mg/dL   Triglycerides 409 (H) 0 - 149 mg/dL   HDL 24 (L) >81 mg/dL   VLDL Cholesterol Cal 55 (H) 5 - 40 mg/dL   LDL Calculated 90 0 - 99 mg/dL   Chol/HDL Ratio 7.0 (H) 0.0 - 5.0 ratio  Hepatic function panel  Result Value Ref Range   Total Protein 7.0 6.0 - 8.5 g/dL   Albumin 4.7 3.5 - 5.5 g/dL   Bilirubin Total 1.1 0.0 - 1.2 mg/dL   Bilirubin, Direct 1.91 0.00 - 0.40 mg/dL   Alkaline Phosphatase 96 39 - 117 IU/L   AST 32 0 - 40 IU/L   ALT 47 (H) 0 - 44 IU/L  Glucose, random  Result Value Ref Range   Glucose 99 65 - 99 mg/dL  Hemoglobin Y7W  Result Value Ref Range   Hgb A1c MFr Bld 5.6 4.8 - 5.6 %   Est. average glucose Bld gHb Est-mCnc 114 mg/dL  TSH  Result Value Ref Range   TSH 0.283 (L) 0.450 - 4.500 uIU/mL   Not taking the rr legs meds  Blood pressure medicine and blood pressure levels reviewed today with patient. Compliant with blood pressure medicine. States does not miss a dose. No obvious side effects. Blood pressure generally good when checked elsewhere. Watching salt intake.   Prediabetes working hard on   Diet.  Has not lost weight.  Trying to get sugars down.  Patient had difficulties this weekend when working outside in the heat.  Started getting very lightheaded.  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  No chest pain no shortness of breath    Positive rash, identified as vitilgo in the past, patient concerned that it may be something more  serious.  Someone with scleroderma-like illness with similar rash he is not concerned  Patient reports his erectile dysfunction did not respond to the Viagra.  Concerned about this.  Wonders what else he can do.        Review of Systems No headache, no major weight loss or weight gain, no chest pain no back pain abdominal pain no change in bowel habits complete ROS otherwise negative     Objective:   Physical Exam  Alert and oriented, vitals reviewed and stable, NAD ENT-TM's and ext canals WNL bilat via otoscopic exam Soft palate, tonsils and post pharynx WNL via oropharyngeal exam Neck-symmetric, no masses; thyroid nonpalpable and nontender Pulmonary-no tachypnea or accessory muscle use; Clear without wheezes via auscultation Card--no abnrml murmurs, rhythm reg and rate WNL Carotid pulses symmetric, without bruits       Assessment & Plan:  Impression 1 vitiligo.  Discussed.  Questions answered.  Patient would still like to see dermatologist again same time  2.  Obstructive sleep apnea.  Ongoing use of mass no obvious side effects  3.  Diabetes.  Discussed.  Blood work stable.  4.  Hypertriglyceridemia stable blood work discussed  5.  Erectile dysfunction.  Worsening would like  to see specialist  6.  Hypothyroidism, controlled suboptimum will decrease strength of levothyroxine rationale discussed  7.  Orthostatic hypotension.  Made worse by enalapril.  Made worse by discussed.  Questions answered  Greater than 50% of this 45 minute face to face visit was spent in counseling and discussion and coordination of care regarding the above diagnosis/diagnosies

## 2018-02-12 ENCOUNTER — Encounter: Payer: Self-pay | Admitting: Family Medicine

## 2018-02-23 DIAGNOSIS — G4733 Obstructive sleep apnea (adult) (pediatric): Secondary | ICD-10-CM | POA: Diagnosis not present

## 2018-02-25 DIAGNOSIS — D485 Neoplasm of uncertain behavior of skin: Secondary | ICD-10-CM | POA: Diagnosis not present

## 2018-02-25 DIAGNOSIS — L80 Vitiligo: Secondary | ICD-10-CM | POA: Diagnosis not present

## 2018-02-25 DIAGNOSIS — D2272 Melanocytic nevi of left lower limb, including hip: Secondary | ICD-10-CM | POA: Diagnosis not present

## 2018-03-26 DIAGNOSIS — G4733 Obstructive sleep apnea (adult) (pediatric): Secondary | ICD-10-CM | POA: Diagnosis not present

## 2018-04-02 ENCOUNTER — Ambulatory Visit (INDEPENDENT_AMBULATORY_CARE_PROVIDER_SITE_OTHER): Payer: BLUE CROSS/BLUE SHIELD | Admitting: Urology

## 2018-04-02 DIAGNOSIS — N5201 Erectile dysfunction due to arterial insufficiency: Secondary | ICD-10-CM

## 2018-04-23 ENCOUNTER — Ambulatory Visit (INDEPENDENT_AMBULATORY_CARE_PROVIDER_SITE_OTHER): Payer: BLUE CROSS/BLUE SHIELD | Admitting: Urology

## 2018-04-23 DIAGNOSIS — N5201 Erectile dysfunction due to arterial insufficiency: Secondary | ICD-10-CM | POA: Diagnosis not present

## 2018-04-25 DIAGNOSIS — G4733 Obstructive sleep apnea (adult) (pediatric): Secondary | ICD-10-CM | POA: Diagnosis not present

## 2018-05-21 ENCOUNTER — Encounter: Payer: Self-pay | Admitting: Family Medicine

## 2018-05-21 ENCOUNTER — Ambulatory Visit: Payer: BLUE CROSS/BLUE SHIELD | Admitting: Family Medicine

## 2018-05-21 VITALS — BP 148/100 | Temp 99.5°F | Ht 72.0 in | Wt 277.6 lb

## 2018-05-21 DIAGNOSIS — J019 Acute sinusitis, unspecified: Secondary | ICD-10-CM

## 2018-05-21 MED ORDER — ALBUTEROL SULFATE HFA 108 (90 BASE) MCG/ACT IN AERS: 2.0000 | INHALATION_SPRAY | Freq: Four times a day (QID) | RESPIRATORY_TRACT | 1 refills | Status: DC | PRN

## 2018-05-21 MED ORDER — DOXYCYCLINE HYCLATE 100 MG PO TABS: 100.0000 mg | ORAL_TABLET | Freq: Two times a day (BID) | ORAL | 0 refills | Status: DC

## 2018-05-21 NOTE — Progress Notes (Signed)
   Subjective:    Patient ID: Chase Carson, male    DOB: 10/05/1970, 48 y.o.   MRN: [id number]  Cough  This is a new problem. Episode onset: dec 27th. Associated symptoms include a fever and wheezing. Pertinent negatives include no chills, sore throat or shortness of breath. Associated symptoms comments: Sinus congestion, wheezing, mild fever, fatigue. Treatments tried: dayquil, vicks, organic cough and cold med.   Started about 12 days ago with cough and congestion, mild fever. Cough productive of white mucous. Reports problems with wheezing at nighttime.   Current smoker: smoking 1/2 ppd. Trying to quit.   Just took BP meds before coming in. States BP normally elevated when he is here but states is lower when he checks elsewhere.   Review of Systems  Constitutional: Positive for fever. Negative for chills.  HENT: Positive for congestion. Negative for sore throat.   Respiratory: Positive for cough and wheezing. Negative for shortness of breath.        Objective:   Physical Exam Vitals signs and nursing note reviewed.  Constitutional:      General: He is not in acute distress.    Appearance: Normal appearance. He is not toxic-appearing.  HENT:     Head: Normocephalic and atraumatic.     Right Ear: Tympanic membrane is retracted.     Left Ear: Tympanic membrane is retracted.     Nose:     Right Sinus: No maxillary sinus tenderness or frontal sinus tenderness.     Left Sinus: Maxillary sinus tenderness present. No frontal sinus tenderness.     Mouth/Throat:     Mouth: Mucous membranes are moist.     Pharynx: Oropharynx is clear.  Neck:     Musculoskeletal: Neck supple. No neck rigidity.  Cardiovascular:     Rate and Rhythm: Normal rate and regular rhythm.     Heart sounds: Normal heart sounds.  Pulmonary:     Effort: Pulmonary effort is normal. No respiratory distress.     Breath sounds: No wheezing or rales.  Lymphadenopathy:     Cervical: No cervical adenopathy.    Skin:    General: Skin is warm and dry.  Neurological:     Mental Status: He is alert and oriented to person, place, and time.  Psychiatric:        Mood and Affect: Mood normal.           Assessment & Plan:  Acute rhinosinusitis  Discussed likely post-viral etiology, will treat with doxycycline. No wheezing on exam today but will give albuterol inhaler to use prn for reported wheezing at home. Warning signs discussed. F/u if symptoms worsen or fail to improve.   Pt's BP is elevated in office and on repeat today. He states BP normally runs lower than this, is compliant with meds, not due for f/u for a few months, will schedule f/u in 1 month with Dr. Brett CanalesSteve. Encouraged patient to check BP elsewhere and keep log of it for his next appt.

## 2018-06-18 ENCOUNTER — Ambulatory Visit: Payer: BLUE CROSS/BLUE SHIELD | Admitting: Family Medicine

## 2018-06-18 ENCOUNTER — Encounter: Payer: Self-pay | Admitting: Family Medicine

## 2018-06-18 VITALS — BP 138/86 | Ht 72.0 in | Wt 274.0 lb

## 2018-06-18 DIAGNOSIS — I10 Essential (primary) hypertension: Secondary | ICD-10-CM

## 2018-06-18 MED ORDER — PREDNISONE 20 MG PO TABS: ORAL_TABLET | ORAL | 0 refills | Status: DC

## 2018-06-18 MED ORDER — FLUTICASONE PROPIONATE HFA 44 MCG/ACT IN AERO: INHALATION_SPRAY | RESPIRATORY_TRACT | 1 refills | Status: DC

## 2018-06-18 NOTE — Progress Notes (Signed)
   Subjective:    Patient ID: Chase Carson, male    DOB: 12/01/1970, 48 y.o.   MRN: [id number]  HPI  Patient arrives for a follow up on elevated blood pressure. Patient states his blood pressure was elevated one month ago at his office visit and came back for a recheck.  Blood pressure medicine and blood pressure levels reviewed today with patient. Compliant with blood pressure medicine. States does not miss a dose. No obvious side effects. Blood pressure generally good when checked elsewhere. Watching salt intake.  Usually takes bp med around 7 am,   Numbers were up last mo but pt states that is due to stress and sickness then Review of Systems No headache, no major weight loss or weight gain, no chest pain no back pain abdominal pain no change in bowel habits complete ROS otherwise negative     Objective:   Physical Exam Alert vitals stable, NAD. Blood pressure good on repeat. HEENT normal. Lungs clear. Heart regular rate and rhythm.        Assessment & Plan:  Impression hypertension good control.  Discussed.  Compliance discussed medications refilled.  Follow-up in 6 months

## 2018-06-25 ENCOUNTER — Telehealth: Payer: Self-pay | Admitting: Family Medicine

## 2018-06-25 DIAGNOSIS — R748 Abnormal levels of other serum enzymes: Secondary | ICD-10-CM

## 2018-06-25 DIAGNOSIS — R5383 Other fatigue: Secondary | ICD-10-CM

## 2018-06-25 DIAGNOSIS — E781 Pure hyperglyceridemia: Secondary | ICD-10-CM

## 2018-06-25 DIAGNOSIS — R7303 Prediabetes: Secondary | ICD-10-CM

## 2018-06-25 DIAGNOSIS — I10 Essential (primary) hypertension: Secondary | ICD-10-CM

## 2018-06-25 NOTE — Telephone Encounter (Signed)
Lip liv A1c tsh

## 2018-06-25 NOTE — Telephone Encounter (Signed)
Does he need to have labwork done before his next appt in March?  If so would like orders put in for [facility].

## 2018-06-25 NOTE — Telephone Encounter (Signed)
Last labs on 02/01/18 Lipid, Hepatic, Glucose random, Hgb A1C and TSH. Please advise. Thank you

## 2018-06-25 NOTE — Telephone Encounter (Signed)
I called and spoke with the pt wife and she is aware of all.( Lab orders placed ).

## 2018-07-02 DIAGNOSIS — R5383 Other fatigue: Secondary | ICD-10-CM | POA: Diagnosis not present

## 2018-07-02 DIAGNOSIS — R7303 Prediabetes: Secondary | ICD-10-CM | POA: Diagnosis not present

## 2018-07-02 DIAGNOSIS — I10 Essential (primary) hypertension: Secondary | ICD-10-CM | POA: Diagnosis not present

## 2018-07-02 DIAGNOSIS — R748 Abnormal levels of other serum enzymes: Secondary | ICD-10-CM | POA: Diagnosis not present

## 2018-07-02 DIAGNOSIS — B078 Other viral warts: Secondary | ICD-10-CM | POA: Diagnosis not present

## 2018-07-02 DIAGNOSIS — D225 Melanocytic nevi of trunk: Secondary | ICD-10-CM | POA: Diagnosis not present

## 2018-07-02 DIAGNOSIS — Z1283 Encounter for screening for malignant neoplasm of skin: Secondary | ICD-10-CM | POA: Diagnosis not present

## 2018-07-02 DIAGNOSIS — E781 Pure hyperglyceridemia: Secondary | ICD-10-CM | POA: Diagnosis not present

## 2018-07-03 LAB — HEPATIC FUNCTION PANEL
ALT: 63 IU/L — ABNORMAL HIGH (ref 0–44)
AST: 30 IU/L (ref 0–40)
Albumin: 4.6 g/dL (ref 4.0–5.0)
Alkaline Phosphatase: 94 IU/L (ref 39–117)
BILIRUBIN TOTAL: 0.9 mg/dL (ref 0.0–1.2)
BILIRUBIN, DIRECT: 0.19 mg/dL (ref 0.00–0.40)
TOTAL PROTEIN: 6.8 g/dL (ref 6.0–8.5)

## 2018-07-03 LAB — LIPID PANEL
Chol/HDL Ratio: 6.9 ratio — ABNORMAL HIGH (ref 0.0–5.0)
Cholesterol, Total: 194 mg/dL (ref 100–199)
HDL: 28 mg/dL — AB (ref >39)
LDL Calculated: 108 mg/dL — ABNORMAL HIGH (ref 0–99)
Triglycerides: 292 mg/dL — ABNORMAL HIGH (ref 0–149)
VLDL Cholesterol Cal: 58 mg/dL — ABNORMAL HIGH (ref 5–40)

## 2018-07-03 LAB — HEMOGLOBIN A1C
ESTIMATED AVERAGE GLUCOSE: 117 mg/dL
Hgb A1c MFr Bld: 5.7 % — ABNORMAL HIGH (ref 4.8–5.6)

## 2018-07-03 LAB — TSH: TSH: 3.48 u[IU]/mL (ref 0.450–4.500)

## 2018-07-29 ENCOUNTER — Telehealth: Payer: Self-pay | Admitting: Family Medicine

## 2018-07-29 MED ORDER — LEVOTHYROXINE SODIUM 137 MCG PO TABS: ORAL_TABLET | ORAL | 0 refills | Status: DC

## 2018-07-29 MED ORDER — ENALAPRIL MALEATE 20 MG PO TABS: 20.0000 mg | ORAL_TABLET | Freq: Every day | ORAL | 0 refills | Status: DC

## 2018-07-29 NOTE — Telephone Encounter (Signed)
Patient notified and verbalized understanding. 

## 2018-07-29 NOTE — Telephone Encounter (Signed)
Pt is needing refill on levothyroxine (SYNTHROID, LEVOTHROID) 137 MCG tablet and enalapril (VASOTEC) 20 MG tablet. Pt has appt set for April 1st.   [facility] - [place], [place] - [address]

## 2018-07-29 NOTE — Telephone Encounter (Signed)
Prescriptions sent electronically to pharmacy. Left message to return call 

## 2018-07-30 ENCOUNTER — Ambulatory Visit: Payer: BLUE CROSS/BLUE SHIELD | Admitting: Family Medicine

## 2018-08-14 ENCOUNTER — Ambulatory Visit: Payer: BLUE CROSS/BLUE SHIELD | Admitting: Family Medicine

## 2018-08-14 ENCOUNTER — Encounter: Payer: Self-pay | Admitting: Family Medicine

## 2018-08-14 ENCOUNTER — Other Ambulatory Visit: Payer: Self-pay

## 2018-08-14 VITALS — BP 132/84 | Temp 98.4°F | Wt 280.4 lb

## 2018-08-14 DIAGNOSIS — E781 Pure hyperglyceridemia: Secondary | ICD-10-CM | POA: Diagnosis not present

## 2018-08-14 DIAGNOSIS — I10 Essential (primary) hypertension: Secondary | ICD-10-CM | POA: Diagnosis not present

## 2018-08-14 DIAGNOSIS — G2581 Restless legs syndrome: Secondary | ICD-10-CM

## 2018-08-14 DIAGNOSIS — R7303 Prediabetes: Secondary | ICD-10-CM | POA: Diagnosis not present

## 2018-08-14 MED ORDER — FLUTICASONE PROPIONATE HFA 44 MCG/ACT IN AERO: INHALATION_SPRAY | RESPIRATORY_TRACT | 5 refills | Status: DC

## 2018-08-14 MED ORDER — ENALAPRIL MALEATE 20 MG PO TABS: 20.0000 mg | ORAL_TABLET | Freq: Every day | ORAL | 1 refills | Status: DC

## 2018-08-14 MED ORDER — LEVOTHYROXINE SODIUM 137 MCG PO TABS: ORAL_TABLET | ORAL | 1 refills | Status: DC

## 2018-08-14 NOTE — Progress Notes (Signed)
   Subjective:    Patient ID: Chase Carson, male    DOB: 08/29/1970, 48 y.o.   MRN: [id number] Patient arrives office for follow-up of numerous Hypertension  This is a chronic problem. Associated symptoms include anxiety. There are no compliance problems.    Pt here today for follow up on HTN. Pt was seen on 06/18/2018 by NP and his BP was high.   Blood pressure medicine and blood pressure levels reviewed today with patient. Compliant with blood pressure medicine. States does not miss a dose. No obvious side effects. Blood pressure generally good when checked elsewhere. Watching salt intake.  Results for orders placed or performed in visit on 06/25/18  Lipid panel  Result Value Ref Range   Cholesterol, Total 194 100 - 199 mg/dL   Triglycerides 409 (H) 0 - 149 mg/dL   HDL 28 (L) >81 mg/dL   VLDL Cholesterol Cal 58 (H) 5 - 40 mg/dL   LDL Calculated 191 (H) 0 - 99 mg/dL   Chol/HDL Ratio 6.9 (H) 0.0 - 5.0 ratio  Hepatic function panel  Result Value Ref Range   Total Protein 6.8 6.0 - 8.5 g/dL   Albumin 4.6 4.0 - 5.0 g/dL   Bilirubin Total 0.9 0.0 - 1.2 mg/dL   Bilirubin, Direct 4.78 0.00 - 0.40 mg/dL   Alkaline Phosphatase 94 39 - 117 IU/L   AST 30 0 - 40 IU/L   ALT 63 (H) 0 - 44 IU/L  Hemoglobin A1c  Result Value Ref Range   Hgb A1c MFr Bld 5.7 (H) 4.8 - 5.6 %   Est. average glucose Bld gHb Est-mCnc 117 mg/dL  TSH  Result Value Ref Range   TSH 3.480 0.450 - 4.500 uIU/mL    Patient compliant with thyroid medicine.  No obvious fatigue.  Does not miss a dose.  Ongoing challenges with restless leg syndrome.  6 with this medicine.  Definitely helping.   Prediabetes.  States his diet mostly good.  Exercising quite a bit through work   Review of Systems No headache, no major weight loss or weight gain, no chest pain no back pain abdominal pain no change in bowel habits complete ROS otherwise negative     Objective:   Physical Exam Alert and oriented, vitals reviewed and  stable, NAD ENT-TM's and ext canals WNL bilat via otoscopic exam Soft palate, tonsils and post pharynx WNL via oropharyngeal exam Neck-symmetric, no masses; thyroid nonpalpable and nontender Pulmonary-no tachypnea or accessory muscle use; Clear without wheezes via auscultation Card--no abnrml murmurs, rhythm reg and rate WNL Carotid pulses symmetric, without bruits        Assessment & Plan:  Impression 1 hypertension.  Good control on repeat discussed maintain same meds  2.  Hyperlipidemia.  Control improved discussed maintain same meds  3.  Hypothyroidism good control to maintain same meds  4.  Restless leg syndrome stable  5.  Prediabetes stable plan follow-up in 6 months diet exercise discussed medications refilled

## 2019-01-03 ENCOUNTER — Telehealth: Payer: Self-pay | Admitting: Family Medicine

## 2019-01-03 DIAGNOSIS — Z79899 Other long term (current) drug therapy: Secondary | ICD-10-CM

## 2019-01-03 DIAGNOSIS — R7303 Prediabetes: Secondary | ICD-10-CM

## 2019-01-03 DIAGNOSIS — E781 Pure hyperglyceridemia: Secondary | ICD-10-CM

## 2019-01-03 NOTE — Telephone Encounter (Signed)
Orders put in. Pt notified.  

## 2019-01-03 NOTE — Telephone Encounter (Signed)
Pt has follow up scheduled for next month on BP does he need lab work done?

## 2019-01-03 NOTE — Telephone Encounter (Signed)
Last labs 07/02/2018: TSH, HgbA1c, Liver, Lipid

## 2019-01-03 NOTE — Telephone Encounter (Signed)
Lip liv glu 

## 2019-01-27 ENCOUNTER — Ambulatory Visit: Payer: BLUE CROSS/BLUE SHIELD | Admitting: Family Medicine

## 2019-01-31 ENCOUNTER — Ambulatory Visit (INDEPENDENT_AMBULATORY_CARE_PROVIDER_SITE_OTHER): Payer: BC Managed Care – PPO | Admitting: Family Medicine

## 2019-01-31 ENCOUNTER — Other Ambulatory Visit: Payer: Self-pay

## 2019-01-31 ENCOUNTER — Encounter: Payer: Self-pay | Admitting: Family Medicine

## 2019-01-31 VITALS — BP 128/82 | Temp 97.6°F | Wt 277.0 lb

## 2019-01-31 DIAGNOSIS — E781 Pure hyperglyceridemia: Secondary | ICD-10-CM | POA: Diagnosis not present

## 2019-01-31 DIAGNOSIS — E039 Hypothyroidism, unspecified: Secondary | ICD-10-CM

## 2019-01-31 DIAGNOSIS — I10 Essential (primary) hypertension: Secondary | ICD-10-CM | POA: Diagnosis not present

## 2019-01-31 DIAGNOSIS — R7303 Prediabetes: Secondary | ICD-10-CM

## 2019-01-31 DIAGNOSIS — Z79899 Other long term (current) drug therapy: Secondary | ICD-10-CM | POA: Diagnosis not present

## 2019-01-31 DIAGNOSIS — G4733 Obstructive sleep apnea (adult) (pediatric): Secondary | ICD-10-CM

## 2019-01-31 DIAGNOSIS — G2581 Restless legs syndrome: Secondary | ICD-10-CM | POA: Diagnosis not present

## 2019-01-31 MED ORDER — LEVOTHYROXINE SODIUM 137 MCG PO TABS: ORAL_TABLET | ORAL | 1 refills | Status: DC

## 2019-01-31 MED ORDER — ENALAPRIL MALEATE 20 MG PO TABS: 20.0000 mg | ORAL_TABLET | Freq: Every day | ORAL | 1 refills | Status: DC

## 2019-01-31 MED ORDER — FLOVENT HFA 44 MCG/ACT IN AERO: INHALATION_SPRAY | RESPIRATORY_TRACT | 5 refills | Status: DC

## 2019-01-31 NOTE — Progress Notes (Signed)
   Subjective:    Patient ID: Chase Carson, male    DOB: 04-Oct-1970, 48 y.o.   MRN: [id number]  Hypertension This is a chronic problem. The current episode started more than 1 year ago. Risk factors for coronary artery disease include male gender. Treatments tried: vasotec. There are no compliance problems.    Patient also has a knot on right knee-nonpainful  Blood pressure medicine and blood pressure levels reviewed today with patient. Compliant with blood pressure medicine. States does not miss a dose. No obvious side effects. Blood pressure generally good when checked elsewhere. Watching salt intake.   Compliant with thyroid medication.  No obvious side effects.  No symptoms of high or low thyroid.  States does not miss a dose.  Ongoing challenges with restless leg syndrome.  Requip does help.  No obvious side effects with it definitely wants to maintain  Ongoing reactive airways Flovent definitely [place] symptoms  Declines flu shot   Patient crouches on his right knee often at work.  Has developed swelling anteriorly.  Concerned that it could be something more serious.  Also gives chronic history of knee difficulties down through the years   Review of Systems No headache, no major weight loss or weight gain, no chest pain no back pain abdominal pain no change in bowel habits complete ROS otherwise negative    Objective:   Physical Exam  Alert and oriented, vitals reviewed and stable, NAD ENT-TM's and ext canals WNL bilat via otoscopic exam Soft palate, tonsils and post pharynx WNL via oropharyngeal exam Neck-symmetric, no masses; thyroid nonpalpable and nontender Pulmonary-no tachypnea or accessory muscle use; Clear without wheezes via auscultation Card--no abnrml murmurs, rhythm reg and rate WNL Carotid pulses symmetric, without bruits  Right knee positive crepitations.  No joint line tenderness.  No joint laxity.  Distinct small and anterior prepatellar bursa   Assessment & Plan:  Impression 1 prepatellar bursitis.  Symptom care discussed.  Avoidance measures discussed.  Patient to use cushion at work  2.  Hypertension blood pressure good control discussed maintain same meds  3.  Asthma clinically stable to maintain same  4.  Restless leg syndrome ongoing to maintain same meds  5.  Hypothyroidism compliant  3.  Hyperlipidemia.

## 2019-02-01 LAB — HEPATIC FUNCTION PANEL
ALT: 57 IU/L — ABNORMAL HIGH (ref 0–44)
AST: 35 IU/L (ref 0–40)
Albumin: 4.7 g/dL (ref 4.0–5.0)
Alkaline Phosphatase: 94 IU/L (ref 39–117)
Bilirubin Total: 1 mg/dL (ref 0.0–1.2)
Bilirubin, Direct: 0.24 mg/dL (ref 0.00–0.40)
Total Protein: 7.2 g/dL (ref 6.0–8.5)

## 2019-02-01 LAB — GLUCOSE, RANDOM: Glucose: 100 mg/dL — ABNORMAL HIGH (ref 65–99)

## 2019-02-01 LAB — LIPID PANEL
Chol/HDL Ratio: 7.5 ratio — ABNORMAL HIGH (ref 0.0–5.0)
Cholesterol, Total: 188 mg/dL (ref 100–199)
HDL: 25 mg/dL — ABNORMAL LOW (ref >39)
LDL Chol Calc (NIH): 115 mg/dL — ABNORMAL HIGH (ref 0–99)
Triglycerides: 275 mg/dL — ABNORMAL HIGH (ref 0–149)
VLDL Cholesterol Cal: 48 mg/dL — ABNORMAL HIGH (ref 5–40)

## 2019-02-03 ENCOUNTER — Encounter: Payer: Self-pay | Admitting: Family Medicine

## 2019-02-21 ENCOUNTER — Ambulatory Visit (INDEPENDENT_AMBULATORY_CARE_PROVIDER_SITE_OTHER): Payer: BC Managed Care – PPO | Admitting: Family Medicine

## 2019-02-21 ENCOUNTER — Other Ambulatory Visit: Payer: Self-pay

## 2019-02-21 DIAGNOSIS — N410 Acute prostatitis: Secondary | ICD-10-CM

## 2019-02-21 MED ORDER — CIPROFLOXACIN HCL 500 MG PO TABS: 500.0000 mg | ORAL_TABLET | Freq: Two times a day (BID) | ORAL | 0 refills | Status: DC

## 2019-02-21 NOTE — Progress Notes (Signed)
   Subjective:    Patient ID: Chase Carson, male    DOB: 10-19-1970, 48 y.o.   MRN: [id number]  HPI pain with urination and frequent urination. Wakes up at night to urinate. Started about one week ago.  Patient with some aching and pain and discomfort near the scrotum/rectal region deep been feels like prostatitis when he had a before urgency frequency denies sweats chills high fevers PMH benign Virtual Visit via Telephone Note  I connected with Jana Half on 02/21/19 at  3:50 PM EDT by telephone and verified that I am speaking with the correct person using two identifiers.  Location: Patient: home Provider: office   I discussed the limitations, risks, security and privacy concerns of performing an evaluation and management service by telephone and the availability of in person appointments. I also discussed with the patient that there may be a patient responsible charge related to this service. The patient expressed understanding and agreed to proceed.   History of Present Illness:    Observations/Objective:   Assessment and Plan:   Follow Up Instructions:    I discussed the assessment and treatment plan with the patient. The patient was provided an opportunity to ask questions and all were answered. The patient agreed with the plan and demonstrated an understanding of the instructions.   The patient was advised to call back or seek an in-person evaluation if the symptoms worsen or if the condition fails to improve as anticipated.  I provided 15 minutes of non-face-to-face time during this encounter.       Review of Systems  Constitutional: Negative for activity change.  HENT: Negative for congestion and rhinorrhea.   Respiratory: Negative for cough and shortness of breath.   Cardiovascular: Negative for chest pain.  Gastrointestinal: Negative for abdominal pain, diarrhea, nausea and vomiting.  Genitourinary: Positive for frequency. Negative for  discharge, dysuria, flank pain and hematuria.  Neurological: Negative for weakness and headaches.  Psychiatric/Behavioral: Negative for behavioral problems and confusion.       Objective:   Physical Exam Today's visit was via telephone Physical exam was not possible for this visit        Assessment & Plan:  Prostatitis Antibiotics twice daily for the next 4 weeks Follow-up if progressive troubles or worse warning signs discussed

## 2019-04-16 ENCOUNTER — Telehealth: Payer: Self-pay | Admitting: Family Medicine

## 2019-04-16 NOTE — Telephone Encounter (Signed)
Wife calling to make an appt for his physical for work.  Needs it before the end of the year.  She said husband works everyday until 2 and cannot get off work.  All 3:30 physical spots are being held for well child visits. Can we use one of these for his physical?

## 2019-04-16 NOTE — Telephone Encounter (Signed)
Yes not , not Monday not thur not fri

## 2019-04-23 ENCOUNTER — Ambulatory Visit (INDEPENDENT_AMBULATORY_CARE_PROVIDER_SITE_OTHER): Payer: BC Managed Care – PPO | Admitting: Family Medicine

## 2019-04-23 ENCOUNTER — Other Ambulatory Visit: Payer: Self-pay

## 2019-04-23 ENCOUNTER — Encounter: Payer: Self-pay | Admitting: Family Medicine

## 2019-04-23 VITALS — BP 136/90 | Temp 96.9°F | Ht 72.0 in | Wt 282.2 lb

## 2019-04-23 DIAGNOSIS — I10 Essential (primary) hypertension: Secondary | ICD-10-CM

## 2019-04-23 DIAGNOSIS — G4733 Obstructive sleep apnea (adult) (pediatric): Secondary | ICD-10-CM

## 2019-04-23 DIAGNOSIS — Z0000 Encounter for general adult medical examination without abnormal findings: Secondary | ICD-10-CM

## 2019-04-23 DIAGNOSIS — R7303 Prediabetes: Secondary | ICD-10-CM

## 2019-04-23 NOTE — Progress Notes (Signed)
Subjective:    Patient ID: Chase Carson, male    DOB: 08/19/1970, 48 y.o.   MRN: [id number]  HPI The patient comes in today for a wellness visit.    A review of their health history was completed.  A review of medications was also completed.  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    Physically active   Blood pressure medicine and blood pressure levels reviewed today with patient. Compliant with blood pressure medicine. States does not miss a dose. No obvious side effects. Blood pressure generally good when checked elsewhere. Watching salt intake.  Results for orders placed or performed in visit on 01/03/19  Lipid panel  Result Value Ref Range   Cholesterol, Total 188 100 - 199 mg/dL   Triglycerides 275 (H) 0 - 149 mg/dL   HDL 25 (L) >39 mg/dL   VLDL Cholesterol Cal 48 (H) 5 - 40 mg/dL   LDL Chol Calc (NIH) 115 (H) 0 - 99 mg/dL   Chol/HDL Ratio 7.5 (H) 0.0 - 5.0 ratio  Hepatic function panel  Result Value Ref Range   Total Protein 7.2 6.0 - 8.5 g/dL   Albumin 4.7 4.0 - 5.0 g/dL   Bilirubin Total 1.0 0.0 - 1.2 mg/dL   Bilirubin, Direct 0.24 0.00 - 0.40 mg/dL   Alkaline Phosphatase 94 39 - 117 IU/L   AST 35 0 - 40 IU/L   ALT 57 (H) 0 - 44 IU/L  Glucose, random  Result Value Ref Range   Glucose 100 (H) 65 - 99 mg/dL   Diet overall beter   Breathing overall pretty good   Uses the cpap faithfullyc, down to four . Seven events per hr    Additional concerns:   Review of Systems  Constitutional: Negative for activity change, appetite change and fever.  HENT: Negative for congestion and rhinorrhea.   Eyes: Negative for discharge.  Respiratory: Negative for cough and wheezing.   Cardiovascular: Negative for chest pain.  Gastrointestinal: Negative  for abdominal pain, blood in stool and vomiting.  Genitourinary: Negative for difficulty urinating and frequency.  Musculoskeletal: Negative for neck pain.  Skin: Negative for rash.  Allergic/Immunologic: Negative for environmental allergies and food allergies.  Neurological: Negative for weakness and headaches.  Psychiatric/Behavioral: Negative for agitation.  All other systems reviewed and are negative.      Objective:   Physical Exam Vitals reviewed.  Constitutional:      Appearance: He is well-developed.  HENT:     Head: Normocephalic and atraumatic.     Right Ear: External ear normal.     Left Ear: External ear normal.     Nose: Nose normal.  Eyes:     Pupils: Pupils are equal, round, and reactive to light.  Neck:     Thyroid: No thyromegaly.  Cardiovascular:     Rate and Rhythm: Normal rate and regular rhythm.     Heart sounds: Normal heart sounds. No murmur.  Pulmonary:     Effort: Pulmonary effort is normal. No respiratory distress.     Breath sounds: Normal breath sounds. No wheezing.  Abdominal:     General: Bowel sounds are normal. There is no distension.     Palpations: Abdomen is soft. There is no mass.     Tenderness: There  is no abdominal tenderness.  Genitourinary:    Penis: Normal.   Musculoskeletal:        General: Normal range of motion.     Cervical back: Normal range of motion and neck supple.  Lymphadenopathy:     Cervical: No cervical adenopathy.  Skin:    General: Skin is warm and dry.     Findings: No erythema.  Neurological:     Mental Status: He is alert.     Motor: No abnormal muscle tone.  Psychiatric:        Behavior: Behavior normal.        Judgment: Judgment normal.           Assessment & Plan:  Impression wellness exam.  Diet discussed.  Exercise discussed.  Encouraged to stop smoking.  Patient declines all vaccines  2.  Hypertension.  Overall good control discussed maintain same meds diet exercise discussed  3.  Chronic  obstructive sleep apnea.  Handling CPAP well.  Definitely helps.  Patient to maintain  Follow-up in 6 months diet exercise discussed blood work prescribed

## 2019-04-27 ENCOUNTER — Encounter: Payer: Self-pay | Admitting: Family Medicine

## 2019-05-06 ENCOUNTER — Telehealth: Payer: Self-pay | Admitting: Urology

## 2019-05-06 NOTE — Telephone Encounter (Signed)
Patient will need refill on medication until next appt

## 2019-05-06 NOTE — Telephone Encounter (Signed)
Medication was refilled in urochart for alprostadil until next appt.

## 2019-05-20 ENCOUNTER — Ambulatory Visit: Payer: BLUE CROSS/BLUE SHIELD | Admitting: Urology

## 2019-05-20 ENCOUNTER — Telehealth: Payer: Self-pay | Admitting: Urology

## 2019-05-20 NOTE — Telephone Encounter (Signed)
[facility] called , the last medication that was sent in for this patient is not available. They asked if something else could be sent in?

## 2019-05-21 NOTE — Telephone Encounter (Signed)
Spoke with [facility]. Rx was escribed via [facility] to [facility]. [facility] received a paper rx from [facility] in dec. 2020  will disregard

## 2019-05-28 ENCOUNTER — Other Ambulatory Visit: Payer: Self-pay

## 2019-05-28 DIAGNOSIS — N529 Male erectile dysfunction, unspecified: Secondary | ICD-10-CM

## 2019-05-28 MED ORDER — ALPROSTADIL (VASODILATOR) 10 MCG IC KIT: PACK | INTRACAVERNOUS | 12 refills | Status: DC

## 2019-06-09 ENCOUNTER — Ambulatory Visit (INDEPENDENT_AMBULATORY_CARE_PROVIDER_SITE_OTHER): Payer: BC Managed Care – PPO | Admitting: Family Medicine

## 2019-06-09 ENCOUNTER — Encounter: Payer: Self-pay | Admitting: Family Medicine

## 2019-06-09 ENCOUNTER — Ambulatory Visit: Payer: BC Managed Care – PPO | Attending: Internal Medicine

## 2019-06-09 ENCOUNTER — Other Ambulatory Visit: Payer: Self-pay

## 2019-06-09 DIAGNOSIS — J310 Chronic rhinitis: Secondary | ICD-10-CM | POA: Diagnosis not present

## 2019-06-09 DIAGNOSIS — Z20822 Contact with and (suspected) exposure to covid-19: Secondary | ICD-10-CM | POA: Diagnosis not present

## 2019-06-09 DIAGNOSIS — J329 Chronic sinusitis, unspecified: Secondary | ICD-10-CM | POA: Diagnosis not present

## 2019-06-09 MED ORDER — CEFPROZIL 500 MG PO TABS: 500.0000 mg | ORAL_TABLET | Freq: Two times a day (BID) | ORAL | 0 refills | Status: DC

## 2019-06-09 NOTE — Progress Notes (Signed)
   Subjective:  Audio video  Patient ID: Chase Carson, male    DOB: 16-Jan-1971, 49 y.o.   MRN: [id number]  HPIsinus drainage about 5 days ago. 2 days ago started having loose stools and then yesterday fever of around 100 and cough.   Virtual Visit via Telephone Note  I connected with Chase Carson on 06/09/19 at  9:30 AM EST by telephone and verified that I am speaking with the correct person using two identifiers.  Location: Patient: home Provider: office   I discussed the limitations, risks, security and privacy concerns of performing an evaluation and management service by telephone and the availability of in person appointments. I also discussed with the patient that there may be a patient responsible charge related to this service. The patient expressed understanding and agreed to proceed.   History of Present Illness:    Observations/Objective:   Assessment and Plan:   Follow Up Instructions:    I discussed the assessment and treatment plan with the patient. The patient was provided an opportunity to ask questions and all were answered. The patient agreed with the plan and demonstrated an understanding of the instructions.   The patient was advised to call back or seek an in-person evaluation if the symptoms worsen or if the condition fails to improve as anticipated.  I provided 20 minutes of non-face-to-face time during this encounter.  Started sinus tue last wk  Sat started feeling fever  t 100 or so off and on  Loose stills since sat  Energy level  Muscle aches   Sinus cong and no chest cong  inga was sick 2 wks ago  Couple scares but no confirmed at work   Review of Systems See above no GI symptoms no rash    Objective:   Physical Exam   Virtual     Assessment & Plan:  Impression Constellation of symptoms strongly significant for COVID-19.  Discussed.  Also history of recurrent rhinosinusitis with does have gunky nasal discharge.   We will cover with antibiotic.  Needs to get COVID-19 testing.  Isolation precautions discussed.  Warning signs discussed

## 2019-06-10 LAB — NOVEL CORONAVIRUS, NAA: SARS-CoV-2, NAA: NOT DETECTED

## 2019-06-18 ENCOUNTER — Encounter: Payer: Self-pay | Admitting: Family Medicine

## 2019-06-24 ENCOUNTER — Ambulatory Visit: Payer: BLUE CROSS/BLUE SHIELD | Admitting: Urology

## 2019-06-24 ENCOUNTER — Encounter: Payer: Self-pay | Admitting: Urology

## 2019-06-24 ENCOUNTER — Other Ambulatory Visit: Payer: Self-pay

## 2019-06-24 VITALS — BP 142/99 | HR 99 | Temp 95.4°F | Ht 72.0 in | Wt 265.0 lb

## 2019-06-24 DIAGNOSIS — N529 Male erectile dysfunction, unspecified: Secondary | ICD-10-CM

## 2019-06-24 MED ORDER — TADALAFIL 20 MG PO TABS: 20.0000 mg | ORAL_TABLET | Freq: Every day | ORAL | 11 refills | Status: DC | PRN

## 2019-06-24 NOTE — Progress Notes (Signed)

## 2019-06-24 NOTE — Progress Notes (Signed)
H&P  Chief Complaint: Erectile Dysfunction  History of Present Illness:   2.9.2021: Here today for follow-up having been started on PGE injections. He has fully discontinued sildenafil. He reports that his first vial of this medication was very effective and that he was very satisfied with his results, but his second and now third have had very little effect if not non at all. He does note that he did have a recent "prostatitis flare" that he thinks may have interfered with his erectile fxn. He describes his sx's as difficulty having a BM, painful urination, lower abdominal pressure, and bloating. He also reports that he has only been using 5 ug at a time rather than 10 because his wife was worried that the full dose was too strong.   Sexual Health Inventory Form 1. How do you rate your confidence that you could get and keep an erection? 2 - Low  2. When you had erections with stimulation, how often were your erections hard enough for penetration? 4 - Most times (much more than half)  3. During sexual intercourse, how often were you able to maintain your erection after you had penetrated your partner?  4 - Most times (much more than half)  4. During sexual intercourse, how difficult was it to maintain your erection to completion of intercourse?  4 - Slightly Difficult  5. When you attempted sexual intercourse, how often was it satisfactory for you? 4 - Most times (much more than half)   Total SHIM Score  18    (below copied from [facility] records):  Chase Carson is a 49 year-old male established patient who is here for erectile dysfunction.  He first stated noticing pain on approximately 03/15/2013. His symptoms did begin gradually. His symptoms have been worse over the last year.   He does have difficulties achieving an erection. He does have problems maintaining his erections. His erections are straight. He has tried Viagra. It did not work.   11.19.2019: 49 year old male presents with his  wife for evaluation and management of erectile dysfunction. Referred by Dr. Calton Golds. Symptoms again over 5 years ago. At 1st, Viagra helped. Now, it does not produce significant erections. He has a normal libido. He has no pain or curvature with erections. He denies prior penile injuries during intercourse. He is hypertensive. He is on enalapril. He uses both cigarettes and vaping devices.   12.10.2019: Here to begin PGE 1 injections (10 ug, PRN).     Past Medical History:  Diagnosis Date  . Hyperlipidemia   . Hypertension     No past surgical history on file.  Home Medications:  Allergies as of 06/24/2019      Reactions   Lopid [gemfibrozil]       Medication List       Accurate as of June 24, 2019  8:42 AM. If you have any questions, ask your nurse or doctor.        albuterol 108 (90 Base) MCG/ACT inhaler Commonly known as: VENTOLIN HFA Inhale 2 puffs into the lungs every 6 (six) hours as needed for wheezing or shortness of breath.   alprostadil 10 MCG injection Commonly known as: EDEX Inject up to 1 ml as directed.   cefPROZIL 500 MG tablet Commonly known as: CEFZIL Take 1 tablet (500 mg total) by mouth 2 (two) times daily.   enalapril 20 MG tablet Commonly known as: Vasotec Take 1 tablet (20 mg total) by mouth daily.   Flovent HFA 44 MCG/ACT inhaler  Generic drug: fluticasone Inhale 2 puffs by mouth twice daily.   levothyroxine 137 MCG tablet Commonly known as: SYNTHROID Take one by mouth daily   Multi Ginseng 1000 MG Caps Take by mouth.   sildenafil 100 MG tablet Commonly known as: Viagra One po two hour prior to sex       Allergies:  Allergies  Allergen Reactions  . Lopid [Gemfibrozil]     Family History  Problem Relation Age of Onset  . Cancer Father        lung  . Peripheral Artery Disease Father   . Cancer Sister        breast  . Stroke Other     Social History:  reports that he has been smoking. He has never used smokeless  tobacco. No history on file for alcohol and drug.  ROS: A complete review of systems was performed.  All systems are negative except for pertinent findings as noted.  Physical Exam:  Vital signs in last 24 hours: There were no vitals taken for this visit. Constitutional:  Alert and oriented, No acute distress Cardiovascular: Regular rate  Respiratory: Normal respiratory effort GI: Abdomen is soft, nontender, nondistended, no abdominal masses. No CVAT.  Genitourinary: Normal male phallus, testes are descended bilaterally and non-tender and without masses, scrotum is normal in appearance without lesions or masses, perineum is normal on inspection. Lymphatic: No lymphadenopathy Neurologic: Grossly intact, no focal deficits Psychiatric: Normal mood and affect  Laboratory Data:  No results for input(s): WBC, HGB, HCT, PLT in the last 72 hours.  No results for input(s): NA, K, CL, GLUCOSE, BUN, CALCIUM, CREATININE in the last 72 hours.  Invalid input(s): CO3   No results found for this or any previous visit (from the past 24 hour(s)). No results found for this or any previous visit (from the past 240 hour(s)).  Renal Function: No results for input(s): CREATININE in the last 168 hours. CrCl cannot be calculated (Patient's most recent lab result is older than the maximum 21 days allowed.).  Radiologic Imaging: No results found.  Impression/Assessment:  He is now exclusively on injection therapy (5 ug at a time, though he was prescribed 10). He may have more consistent success on an increased dose. He would also benefit from increased cardiovascular exercise.  It is likely that his recent episode of what he described as prostatitis was more than likely a flare up of diverticulitis.   Plan:  1. Increased dose of PGE injections -- will also up his syringe count.   2. Return for OV in 1 yr.   3. He would like to start a trial of cilias to use "in case of emergency" should he have  issues with injections.

## 2019-07-30 ENCOUNTER — Ambulatory Visit (INDEPENDENT_AMBULATORY_CARE_PROVIDER_SITE_OTHER): Payer: BC Managed Care – PPO | Admitting: Family Medicine

## 2019-07-30 ENCOUNTER — Other Ambulatory Visit: Payer: Self-pay

## 2019-07-30 DIAGNOSIS — R7303 Prediabetes: Secondary | ICD-10-CM

## 2019-07-30 DIAGNOSIS — E039 Hypothyroidism, unspecified: Secondary | ICD-10-CM | POA: Diagnosis not present

## 2019-07-30 DIAGNOSIS — E781 Pure hyperglyceridemia: Secondary | ICD-10-CM

## 2019-07-30 DIAGNOSIS — Z79899 Other long term (current) drug therapy: Secondary | ICD-10-CM

## 2019-07-30 DIAGNOSIS — I10 Essential (primary) hypertension: Secondary | ICD-10-CM

## 2019-07-30 MED ORDER — FLOVENT HFA 44 MCG/ACT IN AERO: INHALATION_SPRAY | RESPIRATORY_TRACT | 5 refills | Status: DC

## 2019-07-30 MED ORDER — ENALAPRIL MALEATE 20 MG PO TABS: 20.0000 mg | ORAL_TABLET | Freq: Every day | ORAL | 1 refills | Status: DC

## 2019-07-30 MED ORDER — LEVOTHYROXINE SODIUM 137 MCG PO TABS: ORAL_TABLET | ORAL | 1 refills | Status: DC

## 2019-07-30 NOTE — Progress Notes (Signed)
   Subjective:  Audio only  Patient ID: Chase Carson, male    DOB: 07-22-1970, 49 y.o.   MRN: [id number]  Hypertension This is a chronic problem. The current episode started more than 1 year ago. Risk factors for coronary artery disease include dyslipidemia and male gender. Treatments tried: vasotec. There are no compliance problems.    Patient would like to discuss a personal issue  Blood pressure medicine and blood pressure levels reviewed today with patient. Compliant with blood pressure medicine. States does not miss a dose. No obvious side effects. Blood pressure generally good when checked elsewhere. Watching salt intake.  Sleep apnea.  Overall stable.  Compliant with his CPAP.  States definitely helps.   Asthma.  Stable with the medication.  Overall minimal need for rescue inhaler.  thyroid.  Compliant with his dosing.  No symptoms of high or low thyroid.  Review of Systems Virtual Visit via Video Note  I connected with [name] on 07/30/19 at  9:00 AM EDT by a video enabled telemedicine application and verified that I am speaking with the correct person using two identifiers.  Location: Patient: home Provider: office   I discussed the limitations of evaluation and management by telemedicine and the availability of in person appointments. The patient expressed understanding and agreed to proceed.  History of Present Illness:    Observations/Objective:   Assessment and Plan:   Follow Up Instructions:    I discussed the assessment and treatment plan with the patient. The patient was provided an opportunity to ask questions and all were answered. The patient agreed with the plan and demonstrated an understanding of the instructions.   The patient was advised to call back or seek an in-person evaluation if the symptoms worsen or if the condition fails to improve as anticipated.  I provided 30 minutes of non-face-to-face time during this  encounter.       Objective:   Physical Exam   Virtual     Assessment & Plan:  Impression 1 hypertension.  Para good control discussed maintain same meds  2.  Hypothyroidism.  Status uncertain.  Need to check blood work rationale discussed compliance discussed.  3.  Chronic asthma clinically stable.  Compliant Flovent definitely helps  4.  Sleep apnea.  Clinically stable.  Uses CPAP definitely helps.  5.  Chronic smoker encouraged to stop immediately.  6.  Erectile dysfunction followed by urologist  Appropriate blood work.  Medications refilled diet exercise discussed follow-up in 6 months

## 2019-07-31 ENCOUNTER — Ambulatory Visit: Payer: BC Managed Care – PPO | Admitting: Family Medicine

## 2019-08-07 DIAGNOSIS — E039 Hypothyroidism, unspecified: Secondary | ICD-10-CM | POA: Diagnosis not present

## 2019-08-07 DIAGNOSIS — Z79899 Other long term (current) drug therapy: Secondary | ICD-10-CM | POA: Diagnosis not present

## 2019-08-07 DIAGNOSIS — E781 Pure hyperglyceridemia: Secondary | ICD-10-CM | POA: Diagnosis not present

## 2019-08-07 DIAGNOSIS — R7303 Prediabetes: Secondary | ICD-10-CM | POA: Diagnosis not present

## 2019-08-08 LAB — HEPATIC FUNCTION PANEL
ALT: 63 IU/L — ABNORMAL HIGH (ref 0–44)
AST: 43 IU/L — ABNORMAL HIGH (ref 0–40)
Albumin: 4.7 g/dL (ref 4.0–5.0)
Alkaline Phosphatase: 95 IU/L (ref 39–117)
Bilirubin Total: 0.9 mg/dL (ref 0.0–1.2)
Bilirubin, Direct: 0.22 mg/dL (ref 0.00–0.40)
Total Protein: 7.3 g/dL (ref 6.0–8.5)

## 2019-08-08 LAB — HEMOGLOBIN A1C
Est. average glucose Bld gHb Est-mCnc: 137 mg/dL
Hgb A1c MFr Bld: 6.4 % — ABNORMAL HIGH (ref 4.8–5.6)

## 2019-08-08 LAB — BASIC METABOLIC PANEL
BUN/Creatinine Ratio: 10 (ref 9–20)
BUN: 8 mg/dL (ref 6–24)
CO2: 24 mmol/L (ref 20–29)
Calcium: 9.7 mg/dL (ref 8.7–10.2)
Chloride: 100 mmol/L (ref 96–106)
Creatinine, Ser: 0.8 mg/dL (ref 0.76–1.27)
GFR calc Af Amer: 122 mL/min/{1.73_m2} (ref >59)
GFR calc non Af Amer: 106 mL/min/{1.73_m2} (ref >59)
Glucose: 105 mg/dL — ABNORMAL HIGH (ref 65–99)
Potassium: 4.2 mmol/L (ref 3.5–5.2)
Sodium: 140 mmol/L (ref 134–144)

## 2019-08-08 LAB — LIPID PANEL
Chol/HDL Ratio: 7.2 ratio — ABNORMAL HIGH (ref 0.0–5.0)
Cholesterol, Total: 195 mg/dL (ref 100–199)
HDL: 27 mg/dL — ABNORMAL LOW (ref >39)
LDL Chol Calc (NIH): 113 mg/dL — ABNORMAL HIGH (ref 0–99)
Triglycerides: 318 mg/dL — ABNORMAL HIGH (ref 0–149)
VLDL Cholesterol Cal: 55 mg/dL — ABNORMAL HIGH (ref 5–40)

## 2019-08-08 LAB — TSH: TSH: 5.09 u[IU]/mL — ABNORMAL HIGH (ref 0.450–4.500)

## 2019-08-11 ENCOUNTER — Other Ambulatory Visit: Payer: Self-pay

## 2019-08-11 MED ORDER — LEVOTHYROXINE SODIUM 150 MCG PO TABS: ORAL_TABLET | ORAL | 3 refills | Status: DC

## 2019-10-31 ENCOUNTER — Other Ambulatory Visit: Payer: Self-pay

## 2019-10-31 ENCOUNTER — Ambulatory Visit (HOSPITAL_COMMUNITY)
Admission: RE | Admit: 2019-10-31 | Discharge: 2019-10-31 | Disposition: A | Discharge status: Home / Self Care | Payer: BC Managed Care – PPO | Source: Ambulatory Visit | Attending: Family Medicine | Admitting: Family Medicine

## 2019-10-31 ENCOUNTER — Ambulatory Visit: Payer: BC Managed Care – PPO | Admitting: Family Medicine

## 2019-10-31 ENCOUNTER — Encounter: Payer: Self-pay | Admitting: Family Medicine

## 2019-10-31 VITALS — BP 140/88 | HR 97 | Temp 97.0°F | Ht 72.0 in | Wt 290.4 lb

## 2019-10-31 DIAGNOSIS — M25462 Effusion, left knee: Secondary | ICD-10-CM | POA: Diagnosis not present

## 2019-10-31 DIAGNOSIS — M25562 Pain in left knee: Secondary | ICD-10-CM | POA: Diagnosis not present

## 2019-10-31 MED ORDER — MELOXICAM 7.5 MG PO TABS: 7.5000 mg | ORAL_TABLET | Freq: Every day | ORAL | 0 refills | Status: DC

## 2019-10-31 NOTE — Progress Notes (Signed)
° °  Patient ID: Chase Carson, male    DOB: [birth year], 49 y.o.   MRN: [id number]   Chief Complaint  Patient presents with   Knee Pain    pt states he stepped out of truck and heard a loud crunch in left knee. pt states he was then unable to put any pressure on knee. pt states that for his job he has to be able to move. has been wearing a knee brace for 24 hrs with some relief. pt has also been using Motrin    Subjective:    HPI Pt having left knee pain. 5 days ago got out of truck and felt a loud sound in knee and pain with walking.  No trauma to knee directly. No rash, swelling, or redness.  Using brace on it last few days and improving No past surgery on knee. Has lots of 'creaks and cracks with foot/ankle.   Left knee xray- normal in 2010.  Medical History Hadrian has a past medical history of Hyperlipidemia and Hypertension.   Outpatient Encounter Medications as of 10/31/2019  Medication Sig   albuterol (PROVENTIL HFA;VENTOLIN HFA) 108 (90 Base) MCG/ACT inhaler Inhale 2 puffs into the lungs every 6 (six) hours as needed for wheezing or shortness of breath.   alprostadil (EDEX) 10 MCG injection Inject up to 1 ml as directed.   enalapril (VASOTEC) 20 MG tablet Take 1 tablet (20 mg total) by mouth daily.   fluticasone (FLOVENT HFA) 44 MCG/ACT inhaler Inhale 2 puffs by mouth twice daily.   levothyroxine (SYNTHROID) 150 MCG tablet Take one by mouth daily   tadalafil (CIALIS) 20 MG tablet Take 1 tablet (20 mg total) by mouth daily as needed for erectile dysfunction.   meloxicam (MOBIC) 7.5 MG tablet Take 1 tablet (7.5 mg total) by mouth daily.   [DISCONTINUED] MULTI GINSENG 1000 MG CAPS Take by mouth.   No facility-administered encounter medications on file as of 10/31/2019.     Review of Systems  Musculoskeletal: Positive for arthralgias (left knee pain).  Skin: Negative for rash.     Vitals BP 140/88    Pulse 97    Temp (!) 97 F (36.1 C)    Ht 6'  (1.829 m)    Wt 290 lb 6.4 oz (131.7 kg)    BMI 39.39 kg/m   Objective:   Physical Exam Vitals and nursing note reviewed.  Constitutional:      Appearance: Normal appearance.  Musculoskeletal:        General: Tenderness (posterior rt knee) present. No swelling or deformity. Normal range of motion.     Right lower leg: No edema.     Left lower leg: No edema.     Comments: Neg homan's -left, neg lachman's on left. ttp posterior left knee. No erythema, warmth, or swelling.   Skin:    Findings: No bruising, erythema, lesion or rash.  Neurological:     General: No focal deficit present.     Mental Status: He is alert and oriented to person, place, and time.     Cranial Nerves: No cranial nerve deficit.     Motor: No weakness.     Gait: Gait normal.      Assessment and Plan   1. Acute pain of left knee - DG Knee Complete 4 Views Left; Future   Start meloxicam daily, rest, ice, elevate.  Cont to use brace prn.   F/u prn.

## 2019-10-31 NOTE — Patient Instructions (Signed)

## 2019-12-16 ENCOUNTER — Telehealth: Payer: Self-pay | Admitting: Family Medicine

## 2019-12-16 DIAGNOSIS — E781 Pure hyperglyceridemia: Secondary | ICD-10-CM

## 2019-12-16 DIAGNOSIS — I10 Essential (primary) hypertension: Secondary | ICD-10-CM

## 2019-12-16 DIAGNOSIS — R748 Abnormal levels of other serum enzymes: Secondary | ICD-10-CM

## 2019-12-16 DIAGNOSIS — E039 Hypothyroidism, unspecified: Secondary | ICD-10-CM

## 2019-12-16 DIAGNOSIS — R7303 Prediabetes: Secondary | ICD-10-CM

## 2019-12-16 NOTE — Telephone Encounter (Signed)
Pt has cpe 9/15 and would like lab work done.     I told pt it was to early for cpe last years was 04/2019. But stated insurance didn't cover last years physical due to having to many and needs it done this year by end of sept for insurance purposes.

## 2019-12-16 NOTE — Telephone Encounter (Signed)
Last labs 08/07/19: Lipid, Liver, Met 7, HgbA1c, TSH

## 2019-12-19 NOTE — Telephone Encounter (Signed)
Labs ordered and pt was notified. 

## 2019-12-19 NOTE — Telephone Encounter (Signed)
Yes, pls order those labs. Thx. Dr Ladona Ridgel

## 2020-01-22 LAB — LIPID PANEL
Chol/HDL Ratio: 8.5 ratio — ABNORMAL HIGH (ref 0.0–5.0)
Cholesterol, Total: 188 mg/dL (ref 100–199)
HDL: 22 mg/dL — ABNORMAL LOW (ref >39)
LDL Chol Calc (NIH): 93 mg/dL (ref 0–99)
Triglycerides: 437 mg/dL — ABNORMAL HIGH (ref 0–149)
VLDL Cholesterol Cal: 73 mg/dL — ABNORMAL HIGH (ref 5–40)

## 2020-01-22 LAB — HEPATIC FUNCTION PANEL
ALT: 82 IU/L — ABNORMAL HIGH (ref 0–44)
AST: 62 IU/L — ABNORMAL HIGH (ref 0–40)
Albumin: 4.4 g/dL (ref 4.0–5.0)
Alkaline Phosphatase: 100 IU/L (ref 48–121)
Bilirubin Total: 0.8 mg/dL (ref 0.0–1.2)
Bilirubin, Direct: 0.22 mg/dL (ref 0.00–0.40)
Total Protein: 6.9 g/dL (ref 6.0–8.5)

## 2020-01-22 LAB — BASIC METABOLIC PANEL
BUN/Creatinine Ratio: 10 (ref 9–20)
BUN: 9 mg/dL (ref 6–24)
CO2: 24 mmol/L (ref 20–29)
Calcium: 9.6 mg/dL (ref 8.7–10.2)
Chloride: 102 mmol/L (ref 96–106)
Creatinine, Ser: 0.88 mg/dL (ref 0.76–1.27)
GFR calc Af Amer: 117 mL/min/{1.73_m2} (ref >59)
GFR calc non Af Amer: 102 mL/min/{1.73_m2} (ref >59)
Glucose: 146 mg/dL — ABNORMAL HIGH (ref 65–99)
Potassium: 4.1 mmol/L (ref 3.5–5.2)
Sodium: 141 mmol/L (ref 134–144)

## 2020-01-22 LAB — HEMOGLOBIN A1C
Est. average glucose Bld gHb Est-mCnc: 163 mg/dL
Hgb A1c MFr Bld: 7.3 % — ABNORMAL HIGH (ref 4.8–5.6)

## 2020-01-22 LAB — TSH: TSH: 5.84 u[IU]/mL — ABNORMAL HIGH (ref 0.450–4.500)

## 2020-01-28 ENCOUNTER — Ambulatory Visit (INDEPENDENT_AMBULATORY_CARE_PROVIDER_SITE_OTHER): Payer: BC Managed Care – PPO | Admitting: Family Medicine

## 2020-01-28 ENCOUNTER — Encounter: Payer: Self-pay | Admitting: Family Medicine

## 2020-01-28 ENCOUNTER — Ambulatory Visit: Payer: BC Managed Care – PPO | Admitting: Family Medicine

## 2020-01-28 ENCOUNTER — Other Ambulatory Visit: Payer: Self-pay

## 2020-01-28 VITALS — BP 132/82 | HR 90 | Temp 97.8°F | Ht 72.0 in | Wt 289.6 lb

## 2020-01-28 DIAGNOSIS — G2581 Restless legs syndrome: Secondary | ICD-10-CM

## 2020-01-28 DIAGNOSIS — I10 Essential (primary) hypertension: Secondary | ICD-10-CM

## 2020-01-28 DIAGNOSIS — R2241 Localized swelling, mass and lump, right lower limb: Secondary | ICD-10-CM

## 2020-01-28 DIAGNOSIS — E039 Hypothyroidism, unspecified: Secondary | ICD-10-CM | POA: Diagnosis not present

## 2020-01-28 DIAGNOSIS — E119 Type 2 diabetes mellitus without complications: Secondary | ICD-10-CM

## 2020-01-28 DIAGNOSIS — Z0000 Encounter for general adult medical examination without abnormal findings: Secondary | ICD-10-CM

## 2020-01-28 MED ORDER — METFORMIN HCL ER 500 MG PO TB24: 500.0000 mg | ORAL_TABLET | Freq: Every day | ORAL | 1 refills | Status: DC

## 2020-01-28 MED ORDER — LEVOTHYROXINE SODIUM 175 MCG PO TABS: ORAL_TABLET | ORAL | 0 refills | Status: DC

## 2020-01-28 MED ORDER — GABAPENTIN 300 MG PO CAPS: ORAL_CAPSULE | ORAL | 1 refills | Status: DC

## 2020-01-28 NOTE — Progress Notes (Addendum)
Patient ID: Chase Carson, male    DOB: Sep 19, 1970, 49 y.o.   MRN: [id number]   Chief Complaint  Patient presents with  . Annual Exam   Subjective:    HPI Pt seen for physical and wife is with pt to discuss other concerns.  Pt needing form filled out for his work wellness form.  The patient comes in today for a wellness visit.  A review of their health history was completed.  A review of medications was also completed.  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   Additional concerns: place on right knee- hard Restless legs at night- not sleeping well   Pt is not sleeping well.  Pt is moving a lot during night, per the wife. 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 Feeling tired during the day. Has tried medication in past. Pt is using cpap machine and doing well with this.   Waking up with dry mouth and has humidifer on the cpap machine.  Rubbing holes in the sheet from the foot movement, that is very excessive per the wife. Wife can feel his leg and muscles jumping at night.  She tries to put her foot on the leg to see if it will stop, but can feel the muscle twitching underneath it still.  Tried requip, mirapex, and nabumetone, has had this concern since 2015.   Rt knee -nodule-  Has been there about 24yr No tenderness/redness, no new injury. No pain, asymptomatic.  Not wanting to see ortho at this time for it.  Medical History C[place]red[place] a past medical history of Hyperlipidemia and Hypertension.   Outpatient Encounter Medications as of 01/28/2020  Medication Sig  . albuterol (PROVENTIL HFA;VENTOLIN HFA) 108 (90 Base) MCG/ACT inhaler Inhale 2 puffs into the lungs every 6 (six) hours as needed for wheezing or shortness of breath.  . alprostadil (EDEX) 10 MCG injection Inject up to 1  ml as directed.  . enalapril (VASOTEC) 20 MG tablet Take 1 tablet (20 mg total) by mouth daily.  . fluticasone (FLOVENT HFA) 44 MCG/ACT inhaler Inhale 2 puffs by mouth twice daily.  .Marland Kitchengabapentin (NEURONTIN) 300 MG capsule Take 1 tab for 7 days then increase to 2 tab qhs.  . levothyroxine (SYNTHROID) 175 MCG tablet Take one by mouth daily  . meloxicam (MOBIC) 7.5 MG tablet Take 1 tablet (7.5 mg total) by mouth daily.  . metFORMIN (GLUCOPHAGE XR) 500 MG 24 hr tablet Take 1 tablet (500 mg total) by mouth daily with breakfast.  . tadalafil (CIALIS) 20 MG tablet Take 1 tablet (20 mg total) by mouth daily as needed for erectile dysfunction.  . [DISCONTINUED] levothyroxine (SYNTHROID) 150 MCG tablet Take one by mouth daily   No facility-administered encounter medications on file as of 01/28/2020.     Review of Systems  Constitutional: Negative for chills and fever.  HENT: Negative for congestion, rhinorrhea and sore throat.   Respiratory: Negative for cough, shortness of breath and wheezing.   Cardiovascular: Negative for chest pain and leg swelling.  Gastrointestinal: Negative for abdominal pain, diarrhea, nausea and vomiting.  Genitourinary: Negative for dysuria and frequency.  Musculoskeletal: Negative for back pain, gait problem and joint swelling.       +restless legs bilaterally, and rt knee "knot", nonpainful  Skin: Negative for rash.  Neurological:  Negative for dizziness, tremors, seizures, weakness, numbness and headaches.  Psychiatric/Behavioral: Positive for sleep disturbance. Negative for agitation, behavioral problems, confusion, decreased concentration, dysphoric mood, hallucinations, self-injury and suicidal ideas. The patient is not nervous/anxious and is not hyperactive.      Vitals BP 132/82   Pulse 90   Temp 97.8 F (36.6 C) (Oral)   Ht 6' (1.829 m)   Wt 289 lb 9.6 oz (131.4 kg)   SpO2 99%   BMI 39.28 kg/m   Objective:   Physical Exam Vitals and nursing note  reviewed.  Constitutional:      General: He is not in acute distress.    Appearance: Normal appearance. He is not ill-appearing or toxic-appearing.  HENT:     Head: Normocephalic.     Right Ear: Tympanic membrane, ear canal and external ear normal.     Left Ear: Tympanic membrane, ear canal and external ear normal.     Nose: Nose normal. No congestion or rhinorrhea.     Mouth/Throat:     Mouth: Mucous membranes are moist.     Pharynx: No oropharyngeal exudate or posterior oropharyngeal erythema.  Eyes:     Extraocular Movements: Extraocular movements intact.     Conjunctiva/sclera: Conjunctivae normal.     Pupils: Pupils are equal, round, and reactive to light.  Cardiovascular:     Rate and Rhythm: Normal rate and regular rhythm.     Pulses: Normal pulses.     Heart sounds: Normal heart sounds. No murmur heard.   Pulmonary:     Effort: Pulmonary effort is normal. No respiratory distress.     Breath sounds: Normal breath sounds. No wheezing, rhonchi or rales.  Abdominal:     General: Abdomen is flat. Bowel sounds are normal. There is no distension.     Palpations: Abdomen is soft. There is no mass.     Tenderness: There is no abdominal tenderness. There is no guarding or rebound.     Hernia: No hernia is present.  Musculoskeletal:        General: Normal range of motion.     Cervical back: Normal range of motion.     Right lower leg: No edema.     Left lower leg: No edema.     Comments: +pronounced rt tibial tuberosity, and small 1cm round mobile nodule on top of the tuberosity, no redness, warmth, or rash. Normal rom of rt knee.  Skin:    General: Skin is warm and dry.     Findings: No rash.  Neurological:     General: No focal deficit present.     Mental Status: He is alert and oriented to person, place, and time.     Cranial Nerves: No cranial nerve deficit.     Motor: No weakness.     Gait: Gait normal.  Psychiatric:        Mood and Affect: Mood normal.         Behavior: Behavior normal.        Thought Content: Thought content normal.        Judgment: Judgment normal.    Assessment and Plan   1. Well adult health check  2. Essential hypertension, benign  3. Diabetes mellitus without complication (HCC) - metFORMIN (GLUCOPHAGE XR) 500 MG 24 hr tablet; Take 1 tablet (500 mg total) by mouth daily with breakfast.  Dispense: 90 tablet; Refill: 1 - CMP14+EGFR - Hemoglobin A1c - Lipid panel  4. Hypothyroidism, unspecified type - levothyroxine (SYNTHROID) 175 MCG  tablet; Take one by mouth daily  Dispense: 90 tablet; Refill: 0 - TSH  5. Restless leg syndrome, uncontrolled - gabapentin (NEURONTIN) 300 MG capsule; Take 1 tab for 7 days then increase to 2 tab qhs.  Dispense: 45 capsule; Refill: 1  6. Nodule of skin of right lower extremity   RLS- will try trial of gabapentin 332m for 1 wk then increase to 2 tab qhs. Pt to call back in 2-3 wks to let u[place]ore[place] if medication is helping.  Hypothyroidism- elevated tsh, will inc sythroid from 1581m to 17551m  Will recheck on next visit.  DM2- new onset diabetes.  Will start metformin 500m70mily.  Will recheck on next visit.  HM-  Filled out wellness form for pt.  htn- controlled, stable, will cont meds.  Small nodule/cyst on rt tibial tuberosity.  No pain, asymptomatic- will call if wanting to see ortho regarding this.  Likely benign cyst.  Pt declining ortho referral at this time.  F/u 53mo 55morn.

## 2020-04-22 DIAGNOSIS — E119 Type 2 diabetes mellitus without complications: Secondary | ICD-10-CM | POA: Diagnosis not present

## 2020-04-22 DIAGNOSIS — E039 Hypothyroidism, unspecified: Secondary | ICD-10-CM | POA: Diagnosis not present

## 2020-04-23 LAB — CMP14+EGFR
ALT: 45 IU/L — ABNORMAL HIGH (ref 0–44)
AST: 29 IU/L (ref 0–40)
Albumin/Globulin Ratio: 1.9 (ref 1.2–2.2)
Albumin: 4.7 g/dL (ref 4.0–5.0)
Alkaline Phosphatase: 103 IU/L (ref 44–121)
BUN/Creatinine Ratio: 8 — ABNORMAL LOW (ref 9–20)
BUN: 7 mg/dL (ref 6–24)
Bilirubin Total: 0.9 mg/dL (ref 0.0–1.2)
CO2: 25 mmol/L (ref 20–29)
Calcium: 9.7 mg/dL (ref 8.7–10.2)
Chloride: 102 mmol/L (ref 96–106)
Creatinine, Ser: 0.87 mg/dL (ref 0.76–1.27)
GFR calc Af Amer: 118 mL/min/{1.73_m2} (ref >59)
GFR calc non Af Amer: 102 mL/min/{1.73_m2} (ref >59)
Globulin, Total: 2.5 g/dL (ref 1.5–4.5)
Glucose: 106 mg/dL — ABNORMAL HIGH (ref 65–99)
Potassium: 4.7 mmol/L (ref 3.5–5.2)
Sodium: 140 mmol/L (ref 134–144)
Total Protein: 7.2 g/dL (ref 6.0–8.5)

## 2020-04-23 LAB — TSH: TSH: 0.296 u[IU]/mL — ABNORMAL LOW (ref 0.450–4.500)

## 2020-04-23 LAB — HEMOGLOBIN A1C
Est. average glucose Bld gHb Est-mCnc: 120 mg/dL
Hgb A1c MFr Bld: 5.8 % — ABNORMAL HIGH (ref 4.8–5.6)

## 2020-04-23 LAB — LIPID PANEL
Chol/HDL Ratio: 7.2 ratio — ABNORMAL HIGH (ref 0.0–5.0)
Cholesterol, Total: 180 mg/dL (ref 100–199)
HDL: 25 mg/dL — ABNORMAL LOW (ref >39)
LDL Chol Calc (NIH): 123 mg/dL — ABNORMAL HIGH (ref 0–99)
Triglycerides: 178 mg/dL — ABNORMAL HIGH (ref 0–149)
VLDL Cholesterol Cal: 32 mg/dL (ref 5–40)

## 2020-04-28 ENCOUNTER — Ambulatory Visit: Payer: BC Managed Care – PPO | Admitting: Family Medicine

## 2020-04-28 ENCOUNTER — Encounter: Payer: Self-pay | Admitting: Family Medicine

## 2020-04-28 ENCOUNTER — Other Ambulatory Visit: Payer: Self-pay

## 2020-04-28 VITALS — BP 138/88 | HR 85 | Temp 96.1°F | Ht 72.0 in | Wt 273.2 lb

## 2020-04-28 DIAGNOSIS — E119 Type 2 diabetes mellitus without complications: Secondary | ICD-10-CM | POA: Diagnosis not present

## 2020-04-28 DIAGNOSIS — G2581 Restless legs syndrome: Secondary | ICD-10-CM | POA: Diagnosis not present

## 2020-04-28 DIAGNOSIS — E039 Hypothyroidism, unspecified: Secondary | ICD-10-CM

## 2020-04-28 DIAGNOSIS — N529 Male erectile dysfunction, unspecified: Secondary | ICD-10-CM

## 2020-04-28 DIAGNOSIS — R5383 Other fatigue: Secondary | ICD-10-CM

## 2020-04-28 DIAGNOSIS — L80 Vitiligo: Secondary | ICD-10-CM

## 2020-04-28 MED ORDER — LEVOTHYROXINE SODIUM 150 MCG PO TABS: ORAL_TABLET | ORAL | 1 refills | Status: DC

## 2020-04-28 MED ORDER — GABAPENTIN 300 MG PO CAPS: ORAL_CAPSULE | ORAL | 5 refills | Status: DC

## 2020-04-28 NOTE — Progress Notes (Signed)
Patient ID: Chase Carson, male    DOB: 1970-10-18, 49 y.o.   MRN: [id number]   Chief Complaint  Patient presents with  . Diabetes  . Hypothyroidism   Subjective:    HPI   Pt here for 6 month follow up for DM2 and hypothyroidism. Pt is having work related stress. "Not exploding on people at work". Pt not checking sugars often. Pt wife has taken away sweets and has limited sugary drinks.   Pt seen for f/u dm2 and hypothyroidism. Glucose good 106.  a1c- 5.8.  tsh- 0.296  Pt taking metformin for dm2.  Thyroid taking synthroid and not having any side effects.   Was on requip for RLS and wasn't very effective and now on gabapentin 600mg  at night and is working well.  Not having leg jerking.  Wife stating in past with his RLS, they would go through sheets due to his kicking at night and ruining the sheets.  Now things have improved.   ED- using cialsis and penile injections. Some times the injections didn't help.  Has seen urology for this in past. Has a combo that is working well.  Cialis- working occasionally.  Tried otc meds for boosting testosterone. Didn't work really.  Fatigue, and ED.   Medical History [place] has a past medical history of Hyperlipidemia and Hypertension.   Outpatient Encounter Medications as of 04/28/2020  Medication Sig  . albuterol (PROVENTIL HFA;VENTOLIN HFA) 108 (90 Base) MCG/ACT inhaler Inhale 2 puffs into the lungs every 6 (six) hours as needed for wheezing or shortness of breath.  . alprostadil (EDEX) 10 MCG injection Inject up to 1 ml as directed.  . enalapril (VASOTEC) 20 MG tablet Take 1 tablet (20 mg total) by mouth daily.  . fluticasone (FLOVENT HFA) 44 MCG/ACT inhaler Inhale 2 puffs by mouth twice daily.  . metFORMIN (GLUCOPHAGE XR) 500 MG 24 hr tablet Take 1 tablet (500 mg total) by mouth daily with breakfast.  . tadalafil (CIALIS) 20 MG tablet Take 1 tablet (20 mg total) by mouth daily as needed for erectile dysfunction.   . [DISCONTINUED] gabapentin (NEURONTIN) 300 MG capsule Take 1 tab for 7 days then increase to 2 tab qhs.  . [DISCONTINUED] levothyroxine (SYNTHROID) 175 MCG tablet Take one by mouth daily  . gabapentin (NEURONTIN) 300 MG capsule Take 2 tab p.o. qhs.  . levothyroxine (SYNTHROID) 150 MCG tablet Take one by mouth daily  . [DISCONTINUED] meloxicam (MOBIC) 7.5 MG tablet Take 1 tablet (7.5 mg total) by mouth daily.   No facility-administered encounter medications on file as of 04/28/2020.     Review of Systems  Constitutional: Positive for fatigue. Negative for chills and fever.  HENT: Negative for congestion, rhinorrhea and sore throat.   Respiratory: Negative for cough, shortness of breath and wheezing.   Cardiovascular: Negative for chest pain and leg swelling.  Gastrointestinal: Negative for abdominal pain, diarrhea, nausea and vomiting.  Genitourinary: Negative for dysuria and frequency.       +ED  Skin: Negative for rash.  Neurological: Negative for dizziness, weakness and headaches.     Vitals BP 138/88   Pulse 85   Temp (!) 96.1 F (35.6 C)   Ht 6' (1.829 m)   Wt 273 lb 3.2 oz (123.9 kg)   SpO2 98%   BMI 37.05 kg/m   Objective:   Physical Exam Vitals and nursing note reviewed.  Constitutional:      General: He is not in acute distress.  Appearance: Normal appearance. He is not ill-appearing.  HENT:     Head: Normocephalic.     Nose: Nose normal. No congestion.     Mouth/Throat:     Mouth: Mucous membranes are moist.     Pharynx: No oropharyngeal exudate.  Eyes:     Extraocular Movements: Extraocular movements intact.     Conjunctiva/sclera: Conjunctivae normal.     Pupils: Pupils are equal, round, and reactive to light.  Cardiovascular:     Rate and Rhythm: Normal rate and regular rhythm.     Pulses: Normal pulses.     Heart sounds: Normal heart sounds. No murmur heard.   Pulmonary:     Effort: Pulmonary effort is normal.     Breath sounds: Normal  breath sounds. No wheezing, rhonchi or rales.  Musculoskeletal:        General: Normal range of motion.     Right lower leg: No edema.     Left lower leg: No edema.  Skin:    General: Skin is warm and dry.     Findings: No rash.  Neurological:     General: No focal deficit present.     Mental Status: He is alert and oriented to person, place, and time.     Cranial Nerves: No cranial nerve deficit.  Psychiatric:        Mood and Affect: Mood normal.        Behavior: Behavior normal.        Thought Content: Thought content normal.        Judgment: Judgment normal.      Assessment and Plan   1. Diabetes mellitus without complication (HCC)  2. Restless leg syndrome, uncontrolled - gabapentin (NEURONTIN) 300 MG capsule; Take 2 tab p.o. qhs.  Dispense: 60 capsule; Refill: 5  3. Hypothyroidism, unspecified type - levothyroxine (SYNTHROID) 150 MCG tablet; Take one by mouth daily  Dispense: 90 tablet; Refill: 1  4. Erectile dysfunction, unspecified erectile dysfunction type - Testosterone,Free and Total  5. Fatigue, unspecified type - Testosterone,Free and Total  6. Vitiligo   DM2- controlled, cont meds.  RLS- improved with gabapentin.  Hypothyroid- tsh was low, will dec from to of synthroid.   Fatigue and ED- will recheck Testosterone, and will see if pt needing androgel.  Vitiligo- pt seeing derm.  F/u 26mo.

## 2020-05-10 ENCOUNTER — Other Ambulatory Visit: Payer: Self-pay

## 2020-05-10 MED ORDER — ENALAPRIL MALEATE 20 MG PO TABS
20.0000 mg | ORAL_TABLET | Freq: Every day | ORAL | 1 refills | Status: DC
Start: 2020-05-10 — End: 2020-11-25

## 2020-05-12 LAB — TESTOSTERONE,FREE AND TOTAL
Testosterone, Free: 7.4 pg/mL (ref 6.8–21.5)
Testosterone: 280 ng/dL (ref 264–916)

## 2020-06-29 ENCOUNTER — Ambulatory Visit: Payer: BC Managed Care – PPO | Admitting: Urology

## 2020-09-30 ENCOUNTER — Encounter: Payer: Self-pay | Admitting: Emergency Medicine

## 2020-09-30 ENCOUNTER — Ambulatory Visit
Admission: EM | Admit: 2020-09-30 | Discharge: 2020-09-30 | Disposition: A | Discharge status: Home / Self Care | Payer: BC Managed Care – PPO | Attending: Emergency Medicine | Admitting: Emergency Medicine

## 2020-09-30 ENCOUNTER — Other Ambulatory Visit: Payer: Self-pay

## 2020-09-30 DIAGNOSIS — J069 Acute upper respiratory infection, unspecified: Secondary | ICD-10-CM

## 2020-09-30 MED ORDER — PREDNISONE 10 MG (21) PO TBPK: ORAL_TABLET | Freq: Every day | ORAL | 0 refills | Status: DC

## 2020-09-30 MED ORDER — BENZONATATE 100 MG PO CAPS: 100.0000 mg | ORAL_CAPSULE | Freq: Three times a day (TID) | ORAL | 0 refills | Status: DC

## 2020-09-30 NOTE — ED Provider Notes (Signed)
[facility]   [id number] 09/30/20 Arrival Time: 1050   CC: flu like symptoms  SUBJECTIVE: History from: patient.  Chase Carson is a 50 y.o. male who presents with fever, body aches, nasal congestion, and sinus congestion/ pressure x 2 days.  Denies sick exposure to COVID, flu or strep.  Denies alleviating or aggravating.  Reports previous symptoms in the past.   Denies fever, sore throat, SOB, wheezing, chest pain, nausea, changes in bowel or bladder habits.    ROS: As per HPI.  All other pertinent ROS negative.     Past Medical History:  Diagnosis Date  . Hyperlipidemia   . Hypertension    History reviewed. No pertinent surgical history. Allergies  Allergen Reactions  . Lopid [Gemfibrozil]    No current facility-administered medications on file prior to encounter.   Current Outpatient Medications on File Prior to Encounter  Medication Sig Dispense Refill  . albuterol (PROVENTIL HFA;VENTOLIN HFA) 108 (90 Base) MCG/ACT inhaler Inhale 2 puffs into the lungs every 6 (six) hours as needed for wheezing or shortness of breath. 1 Inhaler 1  . alprostadil (EDEX) 10 MCG injection Inject up to 1 ml as directed. 1 each 12  . enalapril (VASOTEC) 20 MG tablet Take 1 tablet (20 mg total) by mouth daily. 90 tablet 1  . fluticasone (FLOVENT HFA) 44 MCG/ACT inhaler Inhale 2 puffs by mouth twice daily. 1 Inhaler 5  . gabapentin (NEURONTIN) 300 MG capsule Take 2 tab p.o. qhs. 60 capsule 5  . levothyroxine (SYNTHROID) 150 MCG tablet Take one by mouth daily 90 tablet 1  . metFORMIN (GLUCOPHAGE XR) 500 MG 24 hr tablet Take 1 tablet (500 mg total) by mouth daily with breakfast. 90 tablet 1  . tadalafil (CIALIS) 20 MG tablet Take 1 tablet (20 mg total) by mouth daily as needed for erectile dysfunction. 10 tablet 11   Social History   Socioeconomic History  . Marital status: Married    Spouse name: Not on file  . Number of children: Not on file  . Years of education: Not on file   . Highest education level: Not on file  Occupational History  . Not on file  Tobacco Use  . Smoking status: Current Every Day Smoker    Packs/day: 1.00    Years: 32.00    Pack years: 32.00    Types: Cigarettes  . Smokeless tobacco: Never Used  Substance and Sexual Activity  . Alcohol use: Not Currently  . Drug use: Not on file  . Sexual activity: Not on file  Other Topics Concern  . Not on file  Social History Narrative  . Not on file   Social Determinants of Health   Financial Resource Strain: Not on file  Food Insecurity: Not on file  Transportation Needs: Not on file  Physical Activity: Not on file  Stress: Not on file  Social Connections: Not on file  Intimate Partner Violence: Not on file   Family History  Problem Relation Age of Onset  . Cancer Father        lung  . Peripheral Artery Disease Father   . Cancer Sister        breast  . Stroke Other     OBJECTIVE:  Vitals:   09/30/20 1209  BP: (!) 149/96  Pulse: 77  Resp: 18  Temp: 97.7 F (36.5 C)  TempSrc: Oral  SpO2: 98%     General appearance: alert; appears fatigued, but nontoxic; speaking in full sentences and  tolerating own secretions HEENT: NCAT; Ears: EACs clear, TMs pearly gray; Eyes: PERRL.  EOM grossly intact. Nose: nares patent without rhinorrhea, Throat: oropharynx clear, tonsils non erythematous or enlarged, uvula midline  Neck: supple without LAD Lungs: unlabored respirations, symmetrical air entry; cough: absent; no respiratory distress; CTAB Heart: regular rate and rhythm.   Skin: warm and dry Psychological: alert and cooperative; normal mood and affect  ASSESSMENT & PLAN:  1. Viral URI with cough     Meds ordered this encounter  Medications  . benzonatate (TESSALON) 100 MG capsule    Sig: Take 1 capsule (100 mg total) by mouth every 8 (eight) hours.    Dispense:  21 capsule    Refill:  0    Order Specific Question:   Supervising Provider    Answer:   Eustace Moore  [[id number]]  . predniSONE (STERAPRED UNI-PAK 21 TAB) 10 MG (21) TBPK tablet    Sig: Take by mouth daily. Take 6 tabs by mouth daily  for 2 days, then 5 tabs for 2 days, then 4 tabs for 2 days, then 3 tabs for 2 days, 2 tabs for 2 days, then 1 tab by mouth daily for 2 days    Dispense:  42 tablet    Refill:  0    Order Specific Question:   Supervising Provider    Answer:   Eustace Moore [[id number]]   Get plenty of rest and push fluids Tessalon Perles prescribed for cough Prednisone for congestion Use OTC zyrtec for nasal congestion, runny nose, and/or sore throat Use OTC flonase for nasal congestion and runny nose Use medications daily for symptom relief Use OTC medications like ibuprofen or tylenol as needed fever or pain Call or go to the ED if you have any new or worsening symptoms such as fever, worsening cough, shortness of breath, chest tightness, chest pain, turning blue, changes in mental status, etc...   Reviewed expectations re: course of current medical issues. Questions answered. Outlined signs and symptoms indicating need for more acute intervention. Patient verbalized understanding. After Visit Summary given.         Chase Harding, PA-C 09/30/20 1352

## 2020-09-30 NOTE — ED Triage Notes (Signed)
Fever started on Tuesday. Joint pain, nasal congestion - with greenish drainage, cough, feels tired.

## 2020-09-30 NOTE — Discharge Instructions (Signed)
Get plenty of rest and push fluids Tessalon Perles prescribed for cough Use OTC zyrtec for nasal congestion, runny nose, and/or sore throat Use OTC flonase for nasal congestion and runny nose Use medications daily for symptom relief Use OTC medications like ibuprofen or tylenol as needed fever or pain Call or go to the ED if you have any new or worsening symptoms such as fever, worsening cough, shortness of breath, chest tightness, chest pain, turning blue, changes in mental status, etc...  

## 2020-11-24 ENCOUNTER — Other Ambulatory Visit: Payer: Self-pay | Admitting: Family Medicine

## 2020-11-24 DIAGNOSIS — E039 Hypothyroidism, unspecified: Secondary | ICD-10-CM

## 2020-11-25 NOTE — Telephone Encounter (Signed)
Pt needs labs repeated for TSH. pls order and have pt get them was abnormal last 2 x.   Thx.   Dr. Ladona Ridgel

## 2020-11-26 ENCOUNTER — Other Ambulatory Visit: Payer: Self-pay | Admitting: Family Medicine

## 2020-11-26 DIAGNOSIS — E039 Hypothyroidism, unspecified: Secondary | ICD-10-CM

## 2020-11-26 NOTE — Telephone Encounter (Signed)
Lab orders placed and mailed to patient  

## 2020-12-30 ENCOUNTER — Other Ambulatory Visit: Payer: Self-pay

## 2020-12-30 DIAGNOSIS — E039 Hypothyroidism, unspecified: Secondary | ICD-10-CM

## 2020-12-30 LAB — TSH: TSH: 11.8 u[IU]/mL — ABNORMAL HIGH (ref 0.450–4.500)

## 2020-12-30 MED ORDER — LEVOTHYROXINE SODIUM 150 MCG PO TABS: 150.0000 ug | ORAL_TABLET | Freq: Every day | ORAL | 0 refills | Status: DC

## 2021-01-13 ENCOUNTER — Ambulatory Visit (INDEPENDENT_AMBULATORY_CARE_PROVIDER_SITE_OTHER): Payer: BC Managed Care – PPO | Admitting: Family Medicine

## 2021-01-13 ENCOUNTER — Encounter: Payer: Self-pay | Admitting: Family Medicine

## 2021-01-13 ENCOUNTER — Other Ambulatory Visit: Payer: Self-pay

## 2021-01-13 VITALS — BP 110/86 | HR 89 | Temp 98.8°F | Ht 72.0 in | Wt 280.0 lb

## 2021-01-13 DIAGNOSIS — Z0001 Encounter for general adult medical examination with abnormal findings: Secondary | ICD-10-CM | POA: Diagnosis not present

## 2021-01-13 DIAGNOSIS — E039 Hypothyroidism, unspecified: Secondary | ICD-10-CM | POA: Diagnosis not present

## 2021-01-13 DIAGNOSIS — I10 Essential (primary) hypertension: Secondary | ICD-10-CM

## 2021-01-13 DIAGNOSIS — Z0000 Encounter for general adult medical examination without abnormal findings: Secondary | ICD-10-CM

## 2021-01-13 DIAGNOSIS — E119 Type 2 diabetes mellitus without complications: Secondary | ICD-10-CM

## 2021-01-13 DIAGNOSIS — E781 Pure hyperglyceridemia: Secondary | ICD-10-CM

## 2021-01-13 MED ORDER — LEVOTHYROXINE SODIUM 150 MCG PO TABS: 150.0000 ug | ORAL_TABLET | Freq: Every day | ORAL | 1 refills | Status: DC

## 2021-01-13 MED ORDER — ALBUTEROL SULFATE HFA 108 (90 BASE) MCG/ACT IN AERS: 2.0000 | INHALATION_SPRAY | Freq: Four times a day (QID) | RESPIRATORY_TRACT | 1 refills | Status: DC | PRN

## 2021-01-13 MED ORDER — ENALAPRIL MALEATE 20 MG PO TABS: 20.0000 mg | ORAL_TABLET | Freq: Every day | ORAL | 1 refills | Status: DC

## 2021-01-13 MED ORDER — FLUTICASONE PROPIONATE HFA 44 MCG/ACT IN AERO: INHALATION_SPRAY | RESPIRATORY_TRACT | 3 refills | Status: DC

## 2021-01-13 NOTE — Progress Notes (Signed)
Patient ID: Chase Carson, male    DOB: 01-Jul-1970, 50 y.o.   MRN: [id number]   Chief Complaint  Patient presents with   well adult  The patient comes in today for a wellness visit.  A review of their health history was completed.  A review of medications was also completed.  Any needed refills; no  Eating habits: good  Falls/  MVA accidents in past few months: no  Regular exercise: yard work, Archivist pt sees on regular basis: urology-   Preventative health issues were discussed.   Additional concerns: form   Taking a pre-work out powder.  Some times having some crashing.  Only eating 2x per day. Has been taking HGH-supplement. Feeling better. Cyst on rt knee improving. Vitaligo is improving on hands.   Smoker 3/4 ppd.  Subjective:    HPI  Hypothyroidism- Not having any side effects from thyroid meds.  Compliant with meds. No diarrhea, constipation, depression/anxiety, palpitations, excessive hair loss or weight gain/loss. No heat/cold intolerance.  H/o dm- not currently on metformin, since glucose was imporving with diet modifications. Dm2- Compliant with medications. Checking blood glucose.   Not seeing any high or low numbers.  Denies polyuria or polydipsia.  Eye exam: overdue. Foot exam:no new concerns. Lab Results  Component Value Date   HGBA1C 6.1 (H) 01/14/2021    Drinking less sweet tea. Not much physical activity  Tsh 11.8, on 12/29/20.  Pt was out of his thyroid medication.  12/21- had tsh and was low at 0.296.  pt stating some times forgetting to take meds recently and ran out.  Gabapentin prn for hand and foot/burning pain, not bothering him recently. Then not having to take gabapentin daily.  Medical History Chase Carson has a past medical history of Hyperlipidemia and Hypertension.   Outpatient Encounter Medications as of 01/13/2021  Medication Sig   fenofibrate (TRICOR) 48 MG tablet Take 1 tablet (48 mg total) by  mouth daily.   gabapentin (NEURONTIN) 300 MG capsule Take 2 tab p.o. qhs.   metFORMIN (GLUCOPHAGE XR) 500 MG 24 hr tablet Take 1 tablet (500 mg total) by mouth daily with breakfast.   tadalafil (CIALIS) 20 MG tablet Take 1 tablet (20 mg total) by mouth daily as needed for erectile dysfunction.   [DISCONTINUED] albuterol (PROVENTIL HFA;VENTOLIN HFA) 108 (90 Base) MCG/ACT inhaler Inhale 2 puffs into the lungs every 6 (six) hours as needed for wheezing or shortness of breath.   [DISCONTINUED] benzonatate (TESSALON) 100 MG capsule Take 1 capsule (100 mg total) by mouth every 8 (eight) hours.   [DISCONTINUED] enalapril (VASOTEC) 20 MG tablet Take 1 tablet by mouth once daily   [DISCONTINUED] fluticasone (FLOVENT HFA) 44 MCG/ACT inhaler Inhale 2 puffs by mouth twice daily.   [DISCONTINUED] levothyroxine (EUTHYROX) 150 MCG tablet Take 1 tablet (150 mcg total) by mouth daily. Needs labs.   [DISCONTINUED] predniSONE (STERAPRED UNI-PAK 21 TAB) 10 MG (21) TBPK tablet Take by mouth daily. Take 6 tabs by mouth daily  for 2 days, then 5 tabs for 2 days, then 4 tabs for 2 days, then 3 tabs for 2 days, 2 tabs for 2 days, then 1 tab by mouth daily for 2 days   albuterol (VENTOLIN HFA) 108 (90 Base) MCG/ACT inhaler Inhale 2 puffs into the lungs every 6 (six) hours as needed for wheezing or shortness of breath.   alprostadil (EDEX) 10 MCG injection Inject up to 1 ml as directed. (Patient not taking: Reported on 01/13/2021)  enalapril (VASOTEC) 20 MG tablet Take 1 tablet (20 mg total) by mouth daily.   fluticasone (FLOVENT HFA) 44 MCG/ACT inhaler Inhale 2 puffs by mouth twice daily.   levothyroxine (EUTHYROX) 150 MCG tablet Take 1 tablet (150 mcg total) by mouth daily. Needs labs.Take 1 tablet (150 mcg total) by mouth daily.   No facility-administered encounter medications on file as of 01/13/2021.     Review of Systems  Constitutional:  Negative for chills and fever.  HENT:  Negative for congestion, rhinorrhea and  sore throat.   Respiratory:  Negative for cough, shortness of breath and wheezing.   Cardiovascular:  Negative for chest pain and leg swelling.  Gastrointestinal:  Negative for abdominal pain, diarrhea, nausea and vomiting.  Genitourinary:  Negative for dysuria and frequency.  Skin:  Negative for rash.  Neurological:  Negative for dizziness, weakness and headaches.    Vitals BP 110/86   Pulse 89   Temp 98.8 F (37.1 C)   Ht 6' (1.829 m)   Wt 280 lb (127 kg)   SpO2 98%   BMI 37.97 kg/m   Objective:   Physical Exam Vitals and nursing note reviewed.  Constitutional:      General: He is not in acute distress.    Appearance: Normal appearance. He is not ill-appearing.  HENT:     Head: Normocephalic.     Nose: Nose normal. No congestion.     Mouth/Throat:     Mouth: Mucous membranes are moist.     Pharynx: No oropharyngeal exudate.  Eyes:     Extraocular Movements: Extraocular movements intact.     Conjunctiva/sclera: Conjunctivae normal.     Pupils: Pupils are equal, round, and reactive to light.  Cardiovascular:     Rate and Rhythm: Normal rate and regular rhythm.     Pulses: Normal pulses.     Heart sounds: Normal heart sounds. No murmur heard. Pulmonary:     Effort: Pulmonary effort is normal.     Breath sounds: Normal breath sounds. No wheezing, rhonchi or rales.  Musculoskeletal:        General: Normal range of motion.     Right lower leg: No edema.     Left lower leg: No edema.  Skin:    General: Skin is warm and dry.     Findings: No rash.  Neurological:     General: No focal deficit present.     Mental Status: He is alert and oriented to person, place, and time.     Cranial Nerves: No cranial nerve deficit.  Psychiatric:        Mood and Affect: Mood normal.        Behavior: Behavior normal.        Thought Content: Thought content normal.        Judgment: Judgment normal.     Assessment and Plan   1. Wellness examination - enalapril (VASOTEC) 20 MG  tablet; Take 1 tablet (20 mg total) by mouth daily.  Dispense: 90 tablet; Refill: 1  2. Laboratory tests ordered as part of a complete physical exam (CPE) - CMP14+EGFR; Future - Lipid panel; Future - CBC; Future - CBC - Lipid panel - CMP14+EGFR  3. Diabetes mellitus without complication (HCC) - Hemoglobin A1c; Future - Hemoglobin A1c  4. Hypothyroidism, unspecified type - levothyroxine (EUTHYROX) 150 MCG tablet; Take 1 tablet (150 mcg total) by mouth daily. Needs labs.Take 1 tablet (150 mcg total) by mouth daily.  Dispense: 90 tablet; Refill: 1  5.  Essential hypertension, benign  6. Hypertriglyceridemia - fenofibrate (TRICOR) 48 MG tablet; Take 1 tablet (48 mg total) by mouth daily.  Dispense: 90 tablet; Refill: 1   Dm2- diet modification.  Not on metformin at this time.  Last a1c was 5.8, will recheck today.  Hypothyroidism- uncontrolled. Pt ran out of meds and occ forgets to take his meds.  Will advise pt to take daily and recheck when seeing next provider.  Hypertriglyceridemia- uncontrolled.  Would recommend trying fenofibrate.  Has joint aches with lopid.  Willing to try another medication for his triglycerides.  Call or rto if worsening joint/muscle pains.   Return in about 6 months (around 07/13/2021), or if symptoms worsen or fail to improve.  Wt Readings from Last 3 Encounters:  01/13/21 280 lb (127 kg)  04/28/20 273 lb 3.2 oz (123.9 kg)  01/28/20 289 lb 9.6 oz (131.4 kg)   BP Readings from Last 3 Encounters:  01/13/21 110/86  09/30/20 (!) 149/96  04/28/20 138/88

## 2021-01-14 DIAGNOSIS — Z0000 Encounter for general adult medical examination without abnormal findings: Secondary | ICD-10-CM | POA: Diagnosis not present

## 2021-01-14 DIAGNOSIS — E119 Type 2 diabetes mellitus without complications: Secondary | ICD-10-CM | POA: Diagnosis not present

## 2021-01-15 LAB — CMP14+EGFR
ALT: 62 IU/L — ABNORMAL HIGH (ref 0–44)
AST: 41 IU/L — ABNORMAL HIGH (ref 0–40)
Albumin/Globulin Ratio: 1.7 (ref 1.2–2.2)
Albumin: 4.6 g/dL (ref 4.0–5.0)
Alkaline Phosphatase: 93 IU/L (ref 44–121)
BUN/Creatinine Ratio: 12 (ref 9–20)
BUN: 9 mg/dL (ref 6–24)
Bilirubin Total: 0.9 mg/dL (ref 0.0–1.2)
CO2: 24 mmol/L (ref 20–29)
Calcium: 9.4 mg/dL (ref 8.7–10.2)
Chloride: 100 mmol/L (ref 96–106)
Creatinine, Ser: 0.77 mg/dL (ref 0.76–1.27)
Globulin, Total: 2.7 g/dL (ref 1.5–4.5)
Glucose: 136 mg/dL — ABNORMAL HIGH (ref 65–99)
Potassium: 4.4 mmol/L (ref 3.5–5.2)
Sodium: 140 mmol/L (ref 134–144)
Total Protein: 7.3 g/dL (ref 6.0–8.5)
eGFR: 110 mL/min/{1.73_m2} (ref >59)

## 2021-01-15 LAB — LIPID PANEL
Chol/HDL Ratio: 8.1 ratio — ABNORMAL HIGH (ref 0.0–5.0)
Cholesterol, Total: 186 mg/dL (ref 100–199)
HDL: 23 mg/dL — ABNORMAL LOW (ref >39)
LDL Chol Calc (NIH): 91 mg/dL (ref 0–99)
Triglycerides: 433 mg/dL — ABNORMAL HIGH (ref 0–149)
VLDL Cholesterol Cal: 72 mg/dL — ABNORMAL HIGH (ref 5–40)

## 2021-01-15 LAB — CBC
Hematocrit: 50.1 % (ref 37.5–51.0)
Hemoglobin: 16.9 g/dL (ref 13.0–17.7)
MCH: 30.7 pg (ref 26.6–33.0)
MCHC: 33.7 g/dL (ref 31.5–35.7)
MCV: 91 fL (ref 79–97)
Platelets: 259 10*3/uL (ref 150–450)
RBC: 5.5 x10E6/uL (ref 4.14–5.80)
RDW: 12.7 % (ref 11.6–15.4)
WBC: 12.8 10*3/uL — ABNORMAL HIGH (ref 3.4–10.8)

## 2021-01-15 LAB — HEMOGLOBIN A1C
Est. average glucose Bld gHb Est-mCnc: 128 mg/dL
Hgb A1c MFr Bld: 6.1 % — ABNORMAL HIGH (ref 4.8–5.6)

## 2021-01-24 ENCOUNTER — Encounter: Payer: Self-pay | Admitting: Family Medicine

## 2021-01-24 MED ORDER — FENOFIBRATE 48 MG PO TABS: 48.0000 mg | ORAL_TABLET | Freq: Every day | ORAL | 1 refills | Status: DC

## 2021-03-11 ENCOUNTER — Ambulatory Visit: Payer: BC Managed Care – PPO | Admitting: Nurse Practitioner

## 2021-03-11 ENCOUNTER — Other Ambulatory Visit: Payer: Self-pay

## 2021-03-11 ENCOUNTER — Encounter: Payer: Self-pay | Admitting: Nurse Practitioner

## 2021-03-11 VITALS — BP 143/102 | HR 79 | Temp 98.0°F | Ht 72.0 in | Wt 281.0 lb

## 2021-03-11 DIAGNOSIS — S0183XA Puncture wound without foreign body of other part of head, initial encounter: Secondary | ICD-10-CM | POA: Diagnosis not present

## 2021-03-11 MED ORDER — DOXYCYCLINE HYCLATE 100 MG PO TABS: 100.0000 mg | ORAL_TABLET | Freq: Two times a day (BID) | ORAL | 0 refills | Status: DC

## 2021-03-11 NOTE — Progress Notes (Signed)
   Subjective:    Patient ID: Chase Carson, male    DOB: 1971-02-16, 50 y.o.   MRN: [id number]  HPI C/o bump on upper lip x 7 days - drained pus x 3 days  Swollen lymph node Began after a puncture wound in the area from a metal piece of equipment.  Had 1 on his lip and one in his mid chest area which has healed on its own.  The one on his lip began having a crust and some drainage.  Mildly tender.  Was concerned because he had a large knot which he thinks have been a lymph node which has improved.  Uses CPAP which he thinks might have aggravated the area.  His tetanus vaccine is up-to-date.  Review of Systems     Objective:   Physical Exam NAD.  Alert, oriented.  A small raised circular lesion noted just beneath the right nostril with a brownish scab formation.  Minimal erythema.  Moderately tender to palpation.  Neck supple with minimal adenopathy nontender to palpation.  No significant lymph nodes noted on the right neck area.  Lungs clear.  Heart regular rate rhythm.  Today's Vitals   03/11/21 1533  BP: (!) 143/102  Pulse: 79  Temp: 98 F (36.7 C)  SpO2: 98%  Weight: 281 lb (127.5 kg)  Height: 6' (1.829 m)   Body mass index is 38.11 kg/m.        Assessment & Plan:  Puncture wound of face, initial encounter Secondary bacterial infection Last tetanus vaccine 03/01/2021.  Meds ordered this encounter  Medications   doxycycline (VIBRA-TABS) 100 MG tablet    Sig: Take 1 tablet (100 mg total) by mouth 2 (two) times daily.    Dispense:  20 tablet    Refill:  0    Order Specific Question:   Supervising Provider    Answer:   Lilyan Punt A [[id number]]   Apply warm compresses.  Warning signs reviewed including worsening infection.  Call back if worsens or persist.

## 2021-03-12 ENCOUNTER — Encounter: Payer: Self-pay | Admitting: Nurse Practitioner

## 2021-04-15 ENCOUNTER — Other Ambulatory Visit: Payer: Self-pay

## 2021-04-15 ENCOUNTER — Emergency Department (HOSPITAL_COMMUNITY)
Admission: EM | Admit: 2021-04-15 | Discharge: 2021-04-15 | Disposition: A | Discharge status: Home / Self Care | Payer: BC Managed Care – PPO | Attending: Emergency Medicine | Admitting: Emergency Medicine

## 2021-04-15 ENCOUNTER — Encounter (HOSPITAL_COMMUNITY): Payer: Self-pay

## 2021-04-15 DIAGNOSIS — I10 Essential (primary) hypertension: Secondary | ICD-10-CM | POA: Insufficient documentation

## 2021-04-15 DIAGNOSIS — Z7984 Long term (current) use of oral hypoglycemic drugs: Secondary | ICD-10-CM | POA: Insufficient documentation

## 2021-04-15 DIAGNOSIS — S01112A Laceration without foreign body of left eyelid and periocular area, initial encounter: Secondary | ICD-10-CM | POA: Diagnosis not present

## 2021-04-15 DIAGNOSIS — F17210 Nicotine dependence, cigarettes, uncomplicated: Secondary | ICD-10-CM | POA: Insufficient documentation

## 2021-04-15 DIAGNOSIS — W1809XA Striking against other object with subsequent fall, initial encounter: Secondary | ICD-10-CM | POA: Diagnosis not present

## 2021-04-15 DIAGNOSIS — Y9289 Other specified places as the place of occurrence of the external cause: Secondary | ICD-10-CM | POA: Insufficient documentation

## 2021-04-15 DIAGNOSIS — E1169 Type 2 diabetes mellitus with other specified complication: Secondary | ICD-10-CM | POA: Insufficient documentation

## 2021-04-15 DIAGNOSIS — E039 Hypothyroidism, unspecified: Secondary | ICD-10-CM | POA: Insufficient documentation

## 2021-04-15 DIAGNOSIS — Z79899 Other long term (current) drug therapy: Secondary | ICD-10-CM | POA: Insufficient documentation

## 2021-04-15 DIAGNOSIS — S00202A Unspecified superficial injury of left eyelid and periocular area, initial encounter: Secondary | ICD-10-CM | POA: Diagnosis not present

## 2021-04-15 MED ORDER — LIDOCAINE HCL (PF) 1 % IJ SOLN
INTRAMUSCULAR | Status: AC
  Filled 2021-04-15: qty 30

## 2021-04-15 NOTE — ED Triage Notes (Signed)
Pt to ED from home after falling from bed and striking left eye on nightstand. Bleeding controlled at this time.

## 2021-04-15 NOTE — Discharge Instructions (Signed)
Local wound care with bacitracin twice daily.  Sutures are to be removed in 5 to 7 days.  Please follow-up with your primary doctor for this.  Return to the emergency department if you experience redness of the skin surrounding the wound, pus draining from the wound, increasing pain, or other new and concerning symptoms.

## 2021-04-15 NOTE — ED Provider Notes (Signed)
[facility] EMERGENCY DEPARTMENT Provider Note   CSN: [id number] Arrival date & time: 04/15/21  0236     History Chief Complaint  Patient presents with   Laceration    Chase Carson is a 50 y.o. male.  Patient is a 51 year old male with history of hypertension and hyperlipidemia.  He presents for a left eyelid injury.  Patient fell out of bed and struck his head on the dresser.  He denies any loss of consciousness, neck pain, headache, or visual disturbances.  Bleeding controlled with direct pressure.  The history is provided by the patient.      Past Medical History:  Diagnosis Date   Hyperlipidemia    Hypertension     Patient Active Problem List   Diagnosis Date Noted   Vitiligo 04/28/2020   Diabetes mellitus without complication (HCC) 01/28/2020   Hypothyroidism 01/28/2020   Obstructive sleep apnea 02/14/2017   Prediabetes 02/17/2016   Elevated liver enzymes 02/17/2016   Restless leg syndrome, uncontrolled 08/03/2015   Erectile dysfunction 12/14/2013   Hypertriglyceridemia 11/19/2012   Essential hypertension, benign 11/19/2012    History reviewed. No pertinent surgical history.     Family History  Problem Relation Age of Onset   Cancer Father        lung   Peripheral Artery Disease Father    Cancer Sister        breast   Stroke Other     Social History   Tobacco Use   Smoking status: Every Day    Packs/day: 1.00    Years: 32.00    Pack years: 32.00    Types: Cigarettes   Smokeless tobacco: Never  Substance Use Topics   Alcohol use: Not Currently    Home Medications Prior to Admission medications   Medication Sig Start Date End Date Taking? Authorizing Provider  albuterol (VENTOLIN HFA) 108 (90 Base) MCG/ACT inhaler Inhale 2 puffs into the lungs every 6 (six) hours as needed for wheezing or shortness of breath. 01/13/21   Laroy Apple M, DO  alprostadil (EDEX) 10 MCG injection Inject up to 1 ml as directed. 05/28/19   Marcine Matar,  MD  doxycycline (VIBRA-TABS) 100 MG tablet Take 1 tablet (100 mg total) by mouth 2 (two) times daily. 03/11/21   Campbell Riches, NP  enalapril (VASOTEC) 20 MG tablet Take 1 tablet (20 mg total) by mouth daily. 01/13/21   Ladona Ridgel, Malena M, DO  fenofibrate (TRICOR) 48 MG tablet Take 1 tablet (48 mg total) by mouth daily. 01/24/21   Ladona Ridgel, Malena M, DO  fluticasone (FLOVENT HFA) 44 MCG/ACT inhaler Inhale 2 puffs by mouth twice daily. 01/13/21   Laroy Apple M, DO  gabapentin (NEURONTIN) 300 MG capsule Take 2 tab p.o. qhs. 04/28/20   Ladona Ridgel, Malena M, DO  levothyroxine (EUTHYROX) 150 MCG tablet Take 1 tablet (150 mcg total) by mouth daily. Needs labs.Take 1 tablet (150 mcg total) by mouth daily. 01/13/21   Laroy Apple M, DO  metFORMIN (GLUCOPHAGE XR) 500 MG 24 hr tablet Take 1 tablet (500 mg total) by mouth daily with breakfast. 01/28/20   Laroy Apple M, DO  tadalafil (CIALIS) 20 MG tablet Take 1 tablet (20 mg total) by mouth daily as needed for erectile dysfunction. 06/24/19   Marcine Matar, MD    Allergies    Lopid [gemfibrozil]  Review of Systems   Review of Systems  All other systems reviewed and are negative.  Physical Exam Updated Vital Signs BP (!) 163/108   Pulse  79   Temp 98 F (36.7 C)   Resp 18   Ht 6' (1.829 m)   Wt 122.5 kg   SpO2 100%   BMI 36.62 kg/m   Physical Exam Vitals and nursing note reviewed.  Constitutional:      General: He is not in acute distress.    Appearance: Normal appearance. He is not ill-appearing.  HENT:     Head: Normocephalic.     Comments: There is a 2.5 cm laceration to the base of the left eyebrow. Eyes:     Extraocular Movements: Extraocular movements intact.     Pupils: Pupils are equal, round, and reactive to light.     Comments: Pupils are equally round and reactive.  There is no evidence for orbital entrapment.  There is no proptosis.  Anterior chamber is clear.  Neurological:     Mental Status: He is alert.    ED Results  / Procedures / Treatments   Labs (all labs ordered are listed, but only abnormal results are displayed) Labs Reviewed - No data to display  EKG None  Radiology No results found.  Procedures Procedures   Medications Ordered in ED Medications  lidocaine (PF) (XYLOCAINE) 1 % injection (has no administration in time range)    ED Course  I have reviewed the triage vital signs and the nursing notes.  Pertinent labs & imaging results that were available during my care of the patient were reviewed by me and considered in my medical decision making (see chart for details).    MDM Rules/Calculators/A&P  Laceration repaired as below.  I see no indication for imaging studies.  Patient advised local wound care and suture removal in the next 5 days.  LACERATION REPAIR Performed by: Geoffery Lyons Authorized by: Geoffery Lyons Consent: Verbal consent obtained. Risks and benefits: risks, benefits and alternatives were discussed Consent given by: patient Patient identity confirmed: provided demographic data Prepped and Draped in normal sterile fashion Wound explored  Laceration Location: left eyelid  Laceration Length: 2.5cm  No Foreign Bodies seen or palpated  Anesthesia: local infiltration  Local anesthetic: lidocaine 1% without epinephrine  Anesthetic total: 3 ml  Irrigation method: syringe Amount of cleaning: standard  Skin closure: 6-0 prolene  Number of sutures: 3  Technique: simple interrupted.  Patient tolerance: Patient tolerated the procedure well with no immediate complications.   Final Clinical Impression(s) / ED Diagnoses Final diagnoses:  None    Rx / DC Orders ED Discharge Orders     None        Geoffery Lyons, MD 04/15/21 [phone]

## 2021-04-22 ENCOUNTER — Other Ambulatory Visit: Payer: Self-pay

## 2021-04-22 ENCOUNTER — Ambulatory Visit: Payer: BC Managed Care – PPO | Admitting: Family Medicine

## 2021-04-22 VITALS — BP 129/87 | HR 70 | Ht 72.0 in | Wt 281.8 lb

## 2021-04-22 DIAGNOSIS — Z4802 Encounter for removal of sutures: Secondary | ICD-10-CM | POA: Insufficient documentation

## 2021-04-22 NOTE — Assessment & Plan Note (Signed)
Sutures removed in standard fashion today without difficulty.  Wound looks great.  Advised Vaseline to the wound to help with healing/scarring.

## 2021-04-22 NOTE — Progress Notes (Signed)
Subjective:  Patient ID: Chase Carson, male    DOB: [birth year]  Age: 50 y.o. MRN: [id number]  CC: Chief Complaint  Patient presents with   Suture / Staple Removal    Stitches over left eye have been in there 7 days    HPI:  50 year old male presents for suture removal.  Patient recently suffered a laceration to the left upper eyelid.  Was seen in ER on 12/2.  Note was reviewed.  Wound was closed with 3 Prolene 6-0 sutures.  Wound is well-healed.  No evidence of infection.  Sutures are okay to be removed today.   Patient Active Problem List   Diagnosis Date Noted   Encounter for removal of sutures 04/22/2021   Vitiligo 04/28/2020   Diabetes mellitus without complication (HCC) 01/28/2020   Hypothyroidism 01/28/2020   Obstructive sleep apnea 02/14/2017   Prediabetes 02/17/2016   Elevated liver enzymes 02/17/2016   Restless leg syndrome, uncontrolled 08/03/2015   Erectile dysfunction 12/14/2013   Hypertriglyceridemia 11/19/2012   Essential hypertension, benign 11/19/2012    Social Hx   Social History   Socioeconomic History   Marital status: Married    Spouse name: Not on file   Number of children: Not on file   Years of education: Not on file   Highest education level: Not on file  Occupational History   Not on file  Tobacco Use   Smoking status: Every Day    Packs/day: 1.00    Years: 32.00    Pack years: 32.00    Types: Cigarettes   Smokeless tobacco: Never  Substance and Sexual Activity   Alcohol use: Not Currently   Drug use: Not on file   Sexual activity: Not on file  Other Topics Concern   Not on file  Social History Narrative   Not on file   Social Determinants of Health   Financial Resource Strain: Not on file  Food Insecurity: Not on file  Transportation Needs: Not on file  Physical Activity: Not on file  Stress: Not on file  Social Connections: Not on file    Review of Systems  Constitutional: Negative.   Skin:  Positive for wound.     Objective:  BP 129/87   Pulse 70   Ht 6' (1.829 m)   Wt 281 lb 12.8 oz (127.8 kg)   SpO2 98%   BMI 38.22 kg/m   BP/Weight 04/22/2021 04/15/2021 03/11/2021  Systolic BP 129 165 143  Diastolic BP 87 100 102  Wt. (Lbs) 281.8 270 281  BMI 38.22 36.62 38.11    Physical Exam Constitutional:      General: He is not in acute distress.    Appearance: Normal appearance. He is not ill-appearing.  Eyes:     Comments: Laceration of the left upper eyelid is well-healed.  No surrounding erythema.  No drainage.  3 Prolene sutures in place.  Pulmonary:     Effort: Pulmonary effort is normal. No respiratory distress.  Neurological:     Mental Status: He is alert.  Psychiatric:        Mood and Affect: Mood normal.        Behavior: Behavior normal.    Lab Results  Component Value Date   WBC 12.8 (H) 01/14/2021   HGB 16.9 01/14/2021   HCT 50.1 01/14/2021   PLT 259 01/14/2021   GLUCOSE 136 (H) 01/14/2021   CHOL 186 01/14/2021   TRIG 433 (H) 01/14/2021   HDL 23 (L) 01/14/2021   LDLCALC  91 01/14/2021   ALT 62 (H) 01/14/2021   AST 41 (H) 01/14/2021   NA 140 01/14/2021   K 4.4 01/14/2021   CL 100 01/14/2021   CREATININE 0.77 01/14/2021   BUN 9 01/14/2021   CO2 24 01/14/2021   TSH 11.800 (H) 12/29/2020   HGBA1C 6.1 (H) 01/14/2021   Suture removal: Sutures removed in standard fashion today without difficulty.  Assessment & Plan:   Problem List Items Addressed This Visit       Other   Encounter for removal of sutures - Primary    Sutures removed in standard fashion today without difficulty.  Wound looks great.  Advised Vaseline to the wound to help with healing/scarring.      Follow-up:  As previously scheduled.  Everlene Other DO [facility]

## 2021-05-09 ENCOUNTER — Other Ambulatory Visit: Payer: Self-pay | Admitting: Family Medicine

## 2021-05-09 DIAGNOSIS — E039 Hypothyroidism, unspecified: Secondary | ICD-10-CM

## 2021-05-09 DIAGNOSIS — Z0000 Encounter for general adult medical examination without abnormal findings: Secondary | ICD-10-CM

## 2021-08-07 ENCOUNTER — Other Ambulatory Visit: Payer: Self-pay | Admitting: Family Medicine

## 2021-08-07 DIAGNOSIS — E039 Hypothyroidism, unspecified: Secondary | ICD-10-CM

## 2021-08-07 DIAGNOSIS — Z0000 Encounter for general adult medical examination without abnormal findings: Secondary | ICD-10-CM

## 2021-09-12 ENCOUNTER — Ambulatory Visit
Admission: EM | Admit: 2021-09-12 | Discharge: 2021-09-12 | Disposition: A | Discharge status: Home / Self Care | Payer: BC Managed Care – PPO | Attending: Family Medicine | Admitting: Family Medicine

## 2021-09-12 DIAGNOSIS — K047 Periapical abscess without sinus: Secondary | ICD-10-CM

## 2021-09-12 MED ORDER — CHLORHEXIDINE GLUCONATE 0.12 % MT SOLN
15.0000 mL | Freq: Two times a day (BID) | OROMUCOSAL | 0 refills | Status: DC
Start: 2021-09-12 — End: 2022-01-24

## 2021-09-12 MED ORDER — AMOXICILLIN-POT CLAVULANATE 875-125 MG PO TABS: 1.0000 | ORAL_TABLET | Freq: Two times a day (BID) | ORAL | 0 refills | Status: DC

## 2021-09-12 MED ORDER — LIDOCAINE VISCOUS HCL 2 % MT SOLN: 10.0000 mL | OROMUCOSAL | 0 refills | Status: DC | PRN

## 2021-09-12 NOTE — ED Provider Notes (Signed)
?[facility] ? ? ? ?CSN: [id number] ?Arrival date & time: 09/12/21  [id number] ? ? ?  ? ?History   ?Chief Complaint ?Chief Complaint  ?Patient presents with  ? Abscess  ?  Abscess tooth and facial swelling  ? ? ?HPI ?Chase Carson is a 51 y.o. male.  ? ?Presenting today with 1 day history of progressively worsening right lower tooth pain, swelling, drainage and now some right-sided facial swelling.  Denies fever, chills, dysphagia, new injury to the area.  States he has known ongoing dental issues and the dentist cannot see him for about 2 weeks.  Has been trying ibuprofen with minimal relief. ? ? ?Past Medical History:  ?Diagnosis Date  ? Hyperlipidemia   ? Hypertension   ? ? ?Patient Active Problem List  ? Diagnosis Date Noted  ? Encounter for removal of sutures 04/22/2021  ? Vitiligo 04/28/2020  ? Diabetes mellitus without complication (HCC) 01/28/2020  ? Hypothyroidism 01/28/2020  ? Obstructive sleep apnea 02/14/2017  ? Prediabetes 02/17/2016  ? Elevated liver enzymes 02/17/2016  ? Restless leg syndrome, uncontrolled 08/03/2015  ? Erectile dysfunction 12/14/2013  ? Hypertriglyceridemia 11/19/2012  ? Essential hypertension, benign 11/19/2012  ? ? ?History reviewed. No pertinent surgical history. ? ? ? ? ?Home Medications   ? ?Prior to Admission medications   ?Medication Sig Start Date End Date Taking? Authorizing Provider  ?amoxicillin-clavulanate (AUGMENTIN) 875-125 MG tablet Take 1 tablet by mouth every 12 (twelve) hours. 09/12/21  Yes Particia NearingLane, Genell Thede Elizabeth, PA-C  ?chlorhexidine (PERIDEX) 0.12 % solution Use as directed 15 mLs in the mouth or throat 2 (two) times daily. 09/12/21  Yes Particia NearingLane, Darian Cansler Elizabeth, PA-C  ?lidocaine (XYLOCAINE) 2 % solution Use as directed 10 mLs in the mouth or throat every 3 (three) hours as needed for mouth pain. 09/12/21  Yes Particia NearingLane, Sotiria Keast Elizabeth, PA-C  ?albuterol (VENTOLIN HFA) 108 (90 Base) MCG/ACT inhaler Inhale 2 puffs into the lungs every 6 (six) hours as needed for  wheezing or shortness of breath. 01/13/21   Laroy Appleaylor, Malena M, DO  ?enalapril (VASOTEC) 20 MG tablet Take 1 tablet by mouth once daily 08/08/21   Tommie Samsook, Jayce G, DO  ?fluticasone (FLOVENT HFA) 44 MCG/ACT inhaler Inhale 2 puffs by mouth twice daily. 01/13/21   Annalee Gentaaylor, Malena M, DO  ?levothyroxine (SYNTHROID) 150 MCG tablet Take 1 tablet by mouth once daily 08/08/21   Tommie Samsook, Jayce G, DO  ?metFORMIN (GLUCOPHAGE XR) 500 MG 24 hr tablet Take 1 tablet (500 mg total) by mouth daily with breakfast. 01/28/20   Laroy Appleaylor, Malena M, DO  ?tadalafil (CIALIS) 20 MG tablet Take 1 tablet (20 mg total) by mouth daily as needed for erectile dysfunction. 06/24/19   Marcine Matarahlstedt, Stephen, MD  ? ? ?Family History ?Family History  ?Problem Relation Age of Onset  ? Cancer Father   ?     lung  ? Peripheral Artery Disease Father   ? Cancer Sister   ?     breast  ? Stroke Other   ? ? ?Social History ?Social History  ? ?Tobacco Use  ? Smoking status: Every Day  ?  Packs/day: 1.00  ?  Years: 32.00  ?  Pack years: 32.00  ?  Types: Cigarettes  ? Smokeless tobacco: Never  ?Vaping Use  ? Vaping Use: Never used  ?Substance Use Topics  ? Alcohol use: Not Currently  ? Drug use: Never  ? ? ? ?Allergies   ?Lopid [gemfibrozil] ? ? ?Review of Systems ?Review of Systems ?Per  HPI ? ?Physical Exam ?Triage Vital Signs ?ED Triage Vitals  ?Enc Vitals Group  ?   BP 09/12/21 0844 (!) 151/93  ?   Pulse Rate 09/12/21 0844 94  ?   Resp 09/12/21 0844 20  ?   Temp 09/12/21 0844 98.8 ?F (37.1 ?C)  ?   Temp Source 09/12/21 0844 Oral  ?   SpO2 09/12/21 0844 98 %  ?   Weight --   ?   Height --   ?   Head Circumference --   ?   Peak Flow --   ?   Pain Score 09/12/21 0841 4  ?   Pain Loc --   ?   Pain Edu? --   ?   Excl. in GC? --   ? ?No data found. ? ?Updated Vital Signs ?BP (!) 151/93 (BP Location: Right Arm)   Pulse 94   Temp 98.8 ?F (37.1 ?C) (Oral)   Resp 20   SpO2 98%  ? ?Visual Acuity ?Right Eye Distance:   ?Left Eye Distance:   ?Bilateral Distance:   ? ?Right Eye Near:   ?Left  Eye Near:    ?Bilateral Near:    ? ?Physical Exam ?Vitals and nursing note reviewed.  ?Constitutional:   ?   Appearance: Normal appearance.  ?HENT:  ?   Head: Atraumatic.  ?   Mouth/Throat:  ?   Mouth: Mucous membranes are moist.  ?   Comments: Poor dentition diffusely, gingiva erythematous, edematous with yellow drainage the front lower dental region ?Eyes:  ?   Extraocular Movements: Extraocular movements intact.  ?   Conjunctiva/sclera: Conjunctivae normal.  ?Cardiovascular:  ?   Rate and Rhythm: Normal rate and regular rhythm.  ?Pulmonary:  ?   Effort: Pulmonary effort is normal.  ?   Breath sounds: Normal breath sounds.  ?Musculoskeletal:     ?   General: Normal range of motion.  ?   Cervical back: Normal range of motion and neck supple.  ?Lymphadenopathy:  ?   Cervical: No cervical adenopathy.  ?Skin: ?   General: Skin is warm and dry.  ?Neurological:  ?   General: No focal deficit present.  ?   Mental Status: He is oriented to person, place, and time.  ?Psychiatric:     ?   Mood and Affect: Mood normal.     ?   Thought Content: Thought content normal.     ?   Judgment: Judgment normal.  ? ? ? ?UC Treatments / Results  ?Labs ?(all labs ordered are listed, but only abnormal results are displayed) ?Labs Reviewed - No data to display ? ?EKG ? ? ?Radiology ?No results found. ? ?Procedures ?Procedures (including critical care time) ? ?Medications Ordered in UC ?Medications - No data to display ? ?Initial Impression / Assessment and Plan / UC Course  ?I have reviewed the triage vital signs and the nursing notes. ? ?Pertinent labs & imaging results that were available during my care of the patient were reviewed by me and considered in my medical decision making (see chart for details). ? ?  ? ?Treat with Augmentin, Peridex, viscous lidocaine and over-the-counter pain relievers.  Follow-up with dentist as soon as possible. ? ?Final Clinical Impressions(s) / UC Diagnoses  ? ?Final diagnoses:  ?Dental abscess   ? ?Discharge Instructions   ?None ?  ? ?ED Prescriptions   ? ? Medication Sig Dispense Auth. Provider  ? amoxicillin-clavulanate (AUGMENTIN) 875-125 MG tablet Take 1 tablet by mouth  every 12 (twelve) hours. 14 tablet Particia Nearing, [place]  ? lidocaine (XYLOCAINE) 2 % solution Use as directed 10 mLs in the mouth or throat every 3 (three) hours as needed for mouth pain. 100 mL Particia Nearing, PA-C  ? chlorhexidine (PERIDEX) 0.12 % solution Use as directed 15 mLs in the mouth or throat 2 (two) times daily. 120 mL Particia Nearing, [place]  ? ?  ? ?PDMP not reviewed this encounter. ?  ?Particia Nearing, PA-C ?09/12/21 4034 ? ?

## 2021-09-12 NOTE — ED Triage Notes (Signed)
Pt states his front bottom tooth started hurting last night ? ?Pt states he woke up to swelling on the right side of his face ? ?Pt states he tried 3 Motrin and it helped take the pain down but not completely ? ?Denies Fever ?

## 2022-01-16 ENCOUNTER — Telehealth: Payer: Self-pay | Admitting: Family Medicine

## 2022-01-16 DIAGNOSIS — E781 Pure hyperglyceridemia: Secondary | ICD-10-CM

## 2022-01-16 DIAGNOSIS — E039 Hypothyroidism, unspecified: Secondary | ICD-10-CM

## 2022-01-16 DIAGNOSIS — E119 Type 2 diabetes mellitus without complications: Secondary | ICD-10-CM

## 2022-01-16 DIAGNOSIS — Z79899 Other long term (current) drug therapy: Secondary | ICD-10-CM

## 2022-01-16 DIAGNOSIS — I10 Essential (primary) hypertension: Secondary | ICD-10-CM

## 2022-01-16 DIAGNOSIS — Z0000 Encounter for general adult medical examination without abnormal findings: Secondary | ICD-10-CM

## 2022-01-16 NOTE — Telephone Encounter (Signed)
Patient has a physical coming up on 01/24/22 will need blood work to complete for form for work.  CB# [phone]

## 2022-01-17 ENCOUNTER — Encounter: Payer: BC Managed Care – PPO | Admitting: Family Medicine

## 2022-01-17 NOTE — Telephone Encounter (Signed)
Last labs completed 01/14/21-CMP14+EGFR,LIPID,CBC and A1C. Please advise. Thank you

## 2022-01-17 NOTE — Telephone Encounter (Signed)
Wife Chase Carson [facility]) notified

## 2022-01-17 NOTE — Telephone Encounter (Signed)
Lab orders placed. Left message to return call  

## 2022-01-18 DIAGNOSIS — Z79899 Other long term (current) drug therapy: Secondary | ICD-10-CM | POA: Diagnosis not present

## 2022-01-18 DIAGNOSIS — I10 Essential (primary) hypertension: Secondary | ICD-10-CM | POA: Diagnosis not present

## 2022-01-18 DIAGNOSIS — E039 Hypothyroidism, unspecified: Secondary | ICD-10-CM | POA: Diagnosis not present

## 2022-01-18 DIAGNOSIS — E119 Type 2 diabetes mellitus without complications: Secondary | ICD-10-CM | POA: Diagnosis not present

## 2022-01-19 LAB — CMP14+EGFR
ALT: 57 IU/L — ABNORMAL HIGH (ref 0–44)
AST: 38 IU/L (ref 0–40)
Albumin/Globulin Ratio: 1.8 (ref 1.2–2.2)
Albumin: 4.8 g/dL (ref 4.1–5.1)
Alkaline Phosphatase: 95 IU/L (ref 44–121)
BUN/Creatinine Ratio: 9 (ref 9–20)
BUN: 7 mg/dL (ref 6–24)
Bilirubin Total: 0.7 mg/dL (ref 0.0–1.2)
CO2: 23 mmol/L (ref 20–29)
Calcium: 9.5 mg/dL (ref 8.7–10.2)
Chloride: 97 mmol/L (ref 96–106)
Creatinine, Ser: 0.78 mg/dL (ref 0.76–1.27)
Globulin, Total: 2.7 g/dL (ref 1.5–4.5)
Glucose: 152 mg/dL — ABNORMAL HIGH (ref 70–99)
Potassium: 4.1 mmol/L (ref 3.5–5.2)
Sodium: 137 mmol/L (ref 134–144)
Total Protein: 7.5 g/dL (ref 6.0–8.5)
eGFR: 109 mL/min/{1.73_m2} (ref >59)

## 2022-01-19 LAB — CBC WITH DIFFERENTIAL/PLATELET
Basophils Absolute: 0.1 10*3/uL (ref 0.0–0.2)
Basos: 1 %
EOS (ABSOLUTE): 0.3 10*3/uL (ref 0.0–0.4)
Eos: 2 %
Hematocrit: 50.8 % (ref 37.5–51.0)
Hemoglobin: 17.8 g/dL — ABNORMAL HIGH (ref 13.0–17.7)
Immature Grans (Abs): 0.1 10*3/uL (ref 0.0–0.1)
Immature Granulocytes: 1 %
Lymphocytes Absolute: 3.1 10*3/uL (ref 0.7–3.1)
Lymphs: 21 %
MCH: 31.9 pg (ref 26.6–33.0)
MCHC: 35 g/dL (ref 31.5–35.7)
MCV: 91 fL (ref 79–97)
Monocytes Absolute: 1 10*3/uL — ABNORMAL HIGH (ref 0.1–0.9)
Monocytes: 6 %
Neutrophils Absolute: 10.5 10*3/uL — ABNORMAL HIGH (ref 1.4–7.0)
Neutrophils: 69 %
Platelets: 242 10*3/uL (ref 150–450)
RBC: 5.58 x10E6/uL (ref 4.14–5.80)
RDW: 13.2 % (ref 11.6–15.4)
WBC: 15.1 10*3/uL — ABNORMAL HIGH (ref 3.4–10.8)

## 2022-01-19 LAB — LIPID PANEL
Chol/HDL Ratio: 10 ratio — ABNORMAL HIGH (ref 0.0–5.0)
Cholesterol, Total: 231 mg/dL — ABNORMAL HIGH (ref 100–199)
HDL: 23 mg/dL — ABNORMAL LOW (ref >39)
Triglycerides: 860 mg/dL (ref 0–149)

## 2022-01-19 LAB — TSH: TSH: 38.3 u[IU]/mL — ABNORMAL HIGH (ref 0.450–4.500)

## 2022-01-19 LAB — HEMOGLOBIN A1C
Est. average glucose Bld gHb Est-mCnc: 143 mg/dL
Hgb A1c MFr Bld: 6.6 % — ABNORMAL HIGH (ref 4.8–5.6)

## 2022-01-23 ENCOUNTER — Other Ambulatory Visit: Payer: Self-pay | Admitting: Family Medicine

## 2022-01-23 DIAGNOSIS — E039 Hypothyroidism, unspecified: Secondary | ICD-10-CM

## 2022-01-23 MED ORDER — LEVOTHYROXINE SODIUM 150 MCG PO TABS: 150.0000 ug | ORAL_TABLET | Freq: Every day | ORAL | 0 refills | Status: DC

## 2022-01-23 MED ORDER — FENOFIBRATE 145 MG PO TABS
145.0000 mg | ORAL_TABLET | Freq: Every day | ORAL | 1 refills | Status: DC
Start: 2022-01-23 — End: 2022-01-24

## 2022-01-24 ENCOUNTER — Ambulatory Visit (INDEPENDENT_AMBULATORY_CARE_PROVIDER_SITE_OTHER): Payer: BC Managed Care – PPO | Admitting: Family Medicine

## 2022-01-24 ENCOUNTER — Encounter: Payer: Self-pay | Admitting: Family Medicine

## 2022-01-24 VITALS — BP 133/85 | HR 97 | Temp 98.4°F | Ht 71.5 in | Wt 277.4 lb

## 2022-01-24 DIAGNOSIS — I10 Essential (primary) hypertension: Secondary | ICD-10-CM

## 2022-01-24 DIAGNOSIS — N529 Male erectile dysfunction, unspecified: Secondary | ICD-10-CM

## 2022-01-24 DIAGNOSIS — E039 Hypothyroidism, unspecified: Secondary | ICD-10-CM | POA: Diagnosis not present

## 2022-01-24 DIAGNOSIS — E119 Type 2 diabetes mellitus without complications: Secondary | ICD-10-CM

## 2022-01-24 DIAGNOSIS — E781 Pure hyperglyceridemia: Secondary | ICD-10-CM | POA: Diagnosis not present

## 2022-01-24 DIAGNOSIS — Z0000 Encounter for general adult medical examination without abnormal findings: Secondary | ICD-10-CM

## 2022-01-24 DIAGNOSIS — Z1211 Encounter for screening for malignant neoplasm of colon: Secondary | ICD-10-CM

## 2022-01-24 DIAGNOSIS — R7989 Other specified abnormal findings of blood chemistry: Secondary | ICD-10-CM

## 2022-01-24 DIAGNOSIS — N521 Erectile dysfunction due to diseases classified elsewhere: Secondary | ICD-10-CM | POA: Diagnosis not present

## 2022-01-24 MED ORDER — FENOFIBRATE 145 MG PO TABS: 145.0000 mg | ORAL_TABLET | Freq: Every day | ORAL | 1 refills | Status: DC

## 2022-01-24 MED ORDER — TADALAFIL 20 MG PO TABS: 20.0000 mg | ORAL_TABLET | Freq: Every day | ORAL | 6 refills | Status: DC | PRN

## 2022-01-24 MED ORDER — LEVOTHYROXINE SODIUM 150 MCG PO TABS: 150.0000 ug | ORAL_TABLET | Freq: Every day | ORAL | 1 refills | Status: DC

## 2022-01-24 MED ORDER — ALBUTEROL SULFATE HFA 108 (90 BASE) MCG/ACT IN AERS: 2.0000 | INHALATION_SPRAY | Freq: Four times a day (QID) | RESPIRATORY_TRACT | 5 refills | Status: DC | PRN

## 2022-01-24 MED ORDER — ENALAPRIL MALEATE 20 MG PO TABS: 20.0000 mg | ORAL_TABLET | Freq: Every day | ORAL | 3 refills | Status: DC

## 2022-01-24 MED ORDER — FLUTICASONE PROPIONATE HFA 44 MCG/ACT IN AERO: INHALATION_SPRAY | RESPIRATORY_TRACT | 3 refills | Status: DC

## 2022-01-24 NOTE — Patient Instructions (Signed)
Labs in 6 weeks.  Follow up in 3-6 months.  Take care  Dr. Adriana Simas

## 2022-01-25 ENCOUNTER — Other Ambulatory Visit: Payer: Self-pay

## 2022-01-25 DIAGNOSIS — Z0000 Encounter for general adult medical examination without abnormal findings: Secondary | ICD-10-CM | POA: Insufficient documentation

## 2022-01-25 DIAGNOSIS — E039 Hypothyroidism, unspecified: Secondary | ICD-10-CM

## 2022-01-25 NOTE — Progress Notes (Signed)
Subjective:  Patient ID: Chase Carson, male    DOB: 1970-06-08  Age: 51 y.o. MRN: [id number]  CC: Chief Complaint  Patient presents with   Annual Exam    Pt arrives for annual exam. Pt has form that is needing to be filled out for work.     HPI: 51 year old male with HTN, OSA, DM-2, Hypothyroidism, Vitiligo, ED, Dyslipidemia presents for an annual exam.  Patient reports that he is doing well.  Needs medication refills today.  In regards to his preventative healthcare, he is amenable to Cologuard.  Declines HIV and hepatitis C screening.  These have been postponed.  Eye exam is up-to-date.  Recent labs have been obtained and reviewed with the patient.  Patient Active Problem List   Diagnosis Date Noted   Annual physical exam 01/25/2022   Vitiligo 04/28/2020   Diabetes mellitus without complication (HCC) 01/28/2020   Hypothyroidism 01/28/2020   Obstructive sleep apnea 02/14/2017   Erectile dysfunction 12/14/2013   Hypertriglyceridemia 11/19/2012   Essential hypertension, benign 11/19/2012    Social Hx   Social History   Socioeconomic History   Marital status: Married    Spouse name: Not on file   Number of children: Not on file   Years of education: Not on file   Highest education level: Not on file  Occupational History   Not on file  Tobacco Use   Smoking status: Every Day    Packs/day: 1.00    Years: 32.00    Total pack years: 32.00    Types: Cigarettes   Smokeless tobacco: Never  Vaping Use   Vaping Use: Never used  Substance and Sexual Activity   Alcohol use: Not Currently   Drug use: Never   Sexual activity: Yes    Birth control/protection: None  Other Topics Concern   Not on file  Social History Narrative   Not on file   Social Determinants of Health   Financial Resource Strain: Not on file  Food Insecurity: Not on file  Transportation Needs: Not on file  Physical Activity: Not on file  Stress: Not on file  Social Connections: Not on  file    Review of Systems  Constitutional: Negative.   Respiratory: Negative.    Cardiovascular: Negative.    Objective:  BP 133/85   Pulse 97   Temp 98.4 F (36.9 C)   Ht 5' 11.5" (1.816 m)   Wt 277 lb 6.4 oz (125.8 kg)   SpO2 98%   BMI 38.15 kg/m      01/24/2022    1:37 PM 01/24/2022    1:29 PM 09/12/2021    8:44 AM  BP/Weight  Systolic BP 133 163 151  Diastolic BP 85 100 93  Wt. (Lbs)  277.4   BMI  38.15 kg/m2     Physical Exam Vitals and nursing note reviewed.  Constitutional:      General: He is not in acute distress.    Appearance: Normal appearance.  HENT:     Head: Normocephalic and atraumatic.     Mouth/Throat:     Pharynx: Oropharynx is clear.  Eyes:     General:        Right eye: No discharge.        Left eye: No discharge.     Conjunctiva/sclera: Conjunctivae normal.  Cardiovascular:     Rate and Rhythm: Normal rate and regular rhythm.  Pulmonary:     Effort: Pulmonary effort is normal.  Breath sounds: Normal breath sounds. No wheezing or rales.  Abdominal:     General: There is no distension.     Palpations: Abdomen is soft.     Tenderness: There is no abdominal tenderness.  Neurological:     General: No focal deficit present.     Mental Status: He is alert.  Psychiatric:        Mood and Affect: Mood normal.        Behavior: Behavior normal.     Lab Results  Component Value Date   WBC 15.1 (H) 01/18/2022   HGB 17.8 (H) 01/18/2022   HCT 50.8 01/18/2022   PLT 242 01/18/2022   GLUCOSE 152 (H) 01/18/2022   CHOL 231 (H) 01/18/2022   TRIG 860 (HH) 01/18/2022   HDL 23 (L) 01/18/2022   LDLCALC Comment (A) 01/18/2022   ALT 57 (H) 01/18/2022   AST 38 01/18/2022   NA 137 01/18/2022   K 4.1 01/18/2022   CL 97 01/18/2022   CREATININE 0.78 01/18/2022   BUN 7 01/18/2022   CO2 23 01/18/2022   TSH 38.300 (H) 01/18/2022   HGBA1C 6.6 (H) 01/18/2022     Assessment & Plan:   Problem List Items Addressed This Visit       Cardiovascular  and Mediastinum   Essential hypertension, benign   Relevant Medications   enalapril (VASOTEC) 20 MG tablet   fenofibrate (TRICOR) 145 MG tablet   tadalafil (CIALIS) 20 MG tablet     Endocrine   Diabetes mellitus without complication (HCC)   Relevant Medications   enalapril (VASOTEC) 20 MG tablet   Other Relevant Orders   Microalbumin / creatinine urine ratio   Hypothyroidism   Relevant Medications   levothyroxine (SYNTHROID) 150 MCG tablet   Other Relevant Orders   TSH     Other   Annual physical exam - Primary    Preventative health care updated today. Meds refilled. Labs reviewed with patient. Follow up in 3-6 months.      Erectile dysfunction   Relevant Medications   tadalafil (CIALIS) 20 MG tablet   Other Relevant Orders   Testosterone   Hypertriglyceridemia   Relevant Medications   enalapril (VASOTEC) 20 MG tablet   fenofibrate (TRICOR) 145 MG tablet   tadalafil (CIALIS) 20 MG tablet   Other Relevant Orders   Lipid panel   Other Visit Diagnoses     Colon cancer screening       Relevant Orders   Cologuard       Meds ordered this encounter  Medications   enalapril (VASOTEC) 20 MG tablet    Sig: Take 1 tablet (20 mg total) by mouth daily.    Dispense:  90 tablet    Refill:  3   fluticasone (FLOVENT HFA) 44 MCG/ACT inhaler    Sig: Inhale 2 puffs by mouth twice daily.    Dispense:  1 each    Refill:  3   fenofibrate (TRICOR) 145 MG tablet    Sig: Take 1 tablet (145 mg total) by mouth daily.    Dispense:  90 tablet    Refill:  1   levothyroxine (SYNTHROID) 150 MCG tablet    Sig: Take 1 tablet (150 mcg total) by mouth daily.    Dispense:  90 tablet    Refill:  1   albuterol (VENTOLIN HFA) 108 (90 Base) MCG/ACT inhaler    Sig: Inhale 2 puffs into the lungs every 6 (six) hours as needed for wheezing or shortness of  breath.    Dispense:  1 each    Refill:  5   tadalafil (CIALIS) 20 MG tablet    Sig: Take 1 tablet (20 mg total) by mouth daily as  needed for erectile dysfunction.    Dispense:  20 tablet    Refill:  6    Follow-up:  Labs in 6 weeks; follow up in 3-6 months  Alayla Dethlefs DO [facility]

## 2022-01-25 NOTE — Assessment & Plan Note (Signed)
Preventative health care updated today. Meds refilled. Labs reviewed with patient. Follow up in 3-6 months.

## 2022-03-09 DIAGNOSIS — N521 Erectile dysfunction due to diseases classified elsewhere: Secondary | ICD-10-CM | POA: Diagnosis not present

## 2022-03-09 DIAGNOSIS — E781 Pure hyperglyceridemia: Secondary | ICD-10-CM | POA: Diagnosis not present

## 2022-03-09 DIAGNOSIS — E039 Hypothyroidism, unspecified: Secondary | ICD-10-CM | POA: Diagnosis not present

## 2022-03-09 DIAGNOSIS — E119 Type 2 diabetes mellitus without complications: Secondary | ICD-10-CM | POA: Diagnosis not present

## 2022-03-10 LAB — LIPID PANEL
Chol/HDL Ratio: 6.9 ratio — ABNORMAL HIGH (ref 0.0–5.0)
Cholesterol, Total: 180 mg/dL (ref 100–199)
HDL: 26 mg/dL — ABNORMAL LOW (ref >39)
LDL Chol Calc (NIH): 101 mg/dL — ABNORMAL HIGH (ref 0–99)
Triglycerides: 311 mg/dL — ABNORMAL HIGH (ref 0–149)
VLDL Cholesterol Cal: 53 mg/dL — ABNORMAL HIGH (ref 5–40)

## 2022-03-10 LAB — TSH: TSH: 6.35 u[IU]/mL — ABNORMAL HIGH (ref 0.450–4.500)

## 2022-03-10 LAB — TESTOSTERONE: Testosterone: 180 ng/dL — ABNORMAL LOW (ref 264–916)

## 2022-03-11 LAB — MICROALBUMIN / CREATININE URINE RATIO
Creatinine, Urine: 132.4 mg/dL
Microalb/Creat Ratio: 7 mg/g creat (ref 0–29)
Microalbumin, Urine: 9.6 ug/mL

## 2022-03-12 ENCOUNTER — Other Ambulatory Visit: Payer: Self-pay | Admitting: Family Medicine

## 2022-03-12 DIAGNOSIS — E039 Hypothyroidism, unspecified: Secondary | ICD-10-CM

## 2022-03-12 MED ORDER — LEVOTHYROXINE SODIUM 175 MCG PO TABS: 175.0000 ug | ORAL_TABLET | Freq: Every day | ORAL | 1 refills | Status: DC

## 2022-03-13 NOTE — Addendum Note (Signed)
Addended by: Dairl Ponder on: 03/13/2022 03:11 PM   Modules accepted: Orders

## 2022-03-14 ENCOUNTER — Other Ambulatory Visit: Payer: Self-pay | Admitting: Family Medicine

## 2022-03-14 MED ORDER — ROSUVASTATIN CALCIUM 10 MG PO TABS: 10.0000 mg | ORAL_TABLET | Freq: Every day | ORAL | 3 refills | Status: DC

## 2022-03-21 ENCOUNTER — Ambulatory Visit: Payer: BC Managed Care – PPO | Admitting: Urology

## 2022-03-21 ENCOUNTER — Encounter: Payer: Self-pay | Admitting: Urology

## 2022-03-21 VITALS — BP 116/72 | HR 103

## 2022-03-21 DIAGNOSIS — E291 Testicular hypofunction: Secondary | ICD-10-CM

## 2022-03-21 DIAGNOSIS — Z125 Encounter for screening for malignant neoplasm of prostate: Secondary | ICD-10-CM | POA: Diagnosis not present

## 2022-03-21 DIAGNOSIS — R7989 Other specified abnormal findings of blood chemistry: Secondary | ICD-10-CM

## 2022-03-21 NOTE — Progress Notes (Signed)
Assessment: 1. Hypogonadism in male   2. Low testosterone   3. Prostate cancer screening     Plan: I reviewed the patient's chart including prior office notes and lab results. Diagnosis and management of hypogonadism/low testosterone discussed with the patient in detail today.  Options for management including short acting injections, oral replacement, topical therapy, long-acting injections, and subcutaneous pellets discussed.  Potential side effects of testosterone replacement therapy reviewed. Recommend further evaluation with a.m. labs: Testosterone, LH, prolactin, CBC, PSA. I will contact him with results when available and to discuss treatment options. Information provided to the patient today.  Chief Complaint:  Chief Complaint  Patient presents with   Hypogonadism    History of Present Illness:  Chase Carson is a 51 y.o. male who [place], [place] for evaluation of low testosterone.  He has had gradually worsening symptoms of decreased energy, fatigue, irritability, decreased libido, and erectile dysfunction.  He is able to achieve a partial erection at times.  He does have some early morning erections.  He has previously tried sildenafil, tadalafil, and penile injections for his erectile dysfunction.    Testosterone levels: 12/21 280 10/23 180  ADAM score = 10/10  He has a history of erectile dysfunction and has previously seen Dr. Diona Fanti.  His last visit was in 12/21.  He was using prostaglandin injections at that time.  He has not used the injection therapy for >1-year.  He does not have significant urinary symptoms.  He reports occasional urgency and nocturia x1.  No dysuria or gross hematuria. IPSS = 5 today.  Past Medical History:  Past Medical History:  Diagnosis Date   Hyperlipidemia    Hypertension    Hypothyroidism    Sleep apnea, obstructive     Past Surgical History:  Past Surgical History:  Procedure  Laterality Date   None      Allergies:  Allergies  Allergen Reactions   Lopid [Gemfibrozil] Other (See Comments)    Joint aches.    Family History:  Family History  Problem Relation Age of Onset   Cancer Father        lung   Peripheral Artery Disease Father    Cancer Sister        breast   Stroke Other     Social History:  Social History   Tobacco Use   Smoking status: Every Day    Packs/day: 1.00    Years: 32.00    Total pack years: 32.00    Types: Cigarettes   Smokeless tobacco: Never  Vaping Use   Vaping Use: Never used  Substance Use Topics   Alcohol use: Not Currently   Drug use: Never    Review of symptoms:  Constitutional:  Negative for unexplained weight loss, night sweats, fever, chills ENT:  Negative for nose bleeds, sinus pain, painful swallowing CV:  Negative for chest pain, shortness of breath, exercise intolerance, palpitations, loss of consciousness Resp:  Negative for cough, wheezing, shortness of breath GI:  Negative for nausea, vomiting, diarrhea, bloody stools GU:  Positives noted in HPI; otherwise negative for gross hematuria, dysuria, urinary incontinence Neuro:  Negative for seizures, poor balance, limb weakness, slurred speech Psych:  Negative for lack of energy, depression, anxiety Endocrine:  Negative for polydipsia, polyuria, symptoms of hypoglycemia (dizziness, hunger, sweating) Hematologic:  Negative for anemia, purpura, petechia, prolonged or excessive bleeding, use of anticoagulants  Allergic:  Negative for difficulty breathing or choking as a result  of exposure to anything; no shellfish allergy; no allergic response (rash/itch) to materials, foods  Physical exam: BP 116/72   Pulse (!) 103  GENERAL APPEARANCE:  Well appearing, well developed, well nourished, NAD HEENT: Atraumatic, Normocephalic, oropharynx clear. NECK: Supple without lymphadenopathy or thyromegaly. LUNGS: Clear to auscultation bilaterally. HEART: Regular Rate  and Rhythm without murmurs, gallops, or rubs. ABDOMEN: Soft, non-tender, No Masses. EXTREMITIES: Moves all extremities well.  Without clubbing, cyanosis, or edema. NEUROLOGIC:  Alert and oriented x 3, normal gait, CN II-XII grossly intact.  MENTAL STATUS:  Appropriate. BACK:  Non-tender to palpation.  No CVAT SKIN:  Warm, dry and intact.   GU: Penis:  uncircumcised Meatus: Normal Scrotum: normal, no masses Testis: normal without masses bilateral Prostate: 30 g, NT, no nodules Rectum: Normal tone,  no masses or tenderness   Results: U/A:  dipstick negative

## 2022-03-23 LAB — URINALYSIS, ROUTINE W REFLEX MICROSCOPIC
Bilirubin, UA: NEGATIVE
Glucose, UA: NEGATIVE
Ketones, UA: NEGATIVE
Leukocytes,UA: NEGATIVE
Nitrite, UA: NEGATIVE
Protein,UA: NEGATIVE
RBC, UA: NEGATIVE
Specific Gravity, UA: 1.025 (ref 1.005–1.030)
Urobilinogen, Ur: 0.2 mg/dL (ref 0.2–1.0)
pH, UA: 6 (ref 5.0–7.5)

## 2022-04-13 ENCOUNTER — Other Ambulatory Visit: Payer: BC Managed Care – PPO

## 2022-04-13 DIAGNOSIS — R7989 Other specified abnormal findings of blood chemistry: Secondary | ICD-10-CM | POA: Diagnosis not present

## 2022-04-14 LAB — CBC
Hematocrit: 45.7 % (ref 37.5–51.0)
Hemoglobin: 15.5 g/dL (ref 13.0–17.7)
MCH: 30.8 pg (ref 26.6–33.0)
MCHC: 33.9 g/dL (ref 31.5–35.7)
MCV: 91 fL (ref 79–97)
Platelets: 228 10*3/uL (ref 150–450)
RBC: 5.03 x10E6/uL (ref 4.14–5.80)
RDW: 12.3 % (ref 11.6–15.4)
WBC: 11.4 10*3/uL — ABNORMAL HIGH (ref 3.4–10.8)

## 2022-04-14 LAB — LUTEINIZING HORMONE: LH: 6.3 m[IU]/mL (ref 1.7–8.6)

## 2022-04-14 LAB — PSA, TOTAL AND FREE
PSA, Free Pct: 15 %
PSA, Free: 0.15 ng/mL
Prostate Specific Ag, Serum: 1 ng/mL (ref 0.0–4.0)

## 2022-04-14 LAB — TESTOSTERONE: Testosterone: 257 ng/dL — ABNORMAL LOW (ref 264–916)

## 2022-04-14 LAB — PROLACTIN: Prolactin: 5 ng/mL (ref 4.0–15.2)

## 2022-05-02 ENCOUNTER — Telehealth: Payer: Self-pay

## 2022-05-02 ENCOUNTER — Other Ambulatory Visit: Payer: Self-pay | Admitting: Urology

## 2022-05-02 DIAGNOSIS — R7989 Other specified abnormal findings of blood chemistry: Secondary | ICD-10-CM

## 2022-05-02 MED ORDER — CLOMIPHENE CITRATE 50 MG PO TABS: ORAL_TABLET | ORAL | 11 refills | Status: DC

## 2022-05-02 NOTE — Telephone Encounter (Signed)
Patient aware of MD response and voiced understanding or rx instructions.  Lab apt scheduled for 07/2022

## 2022-05-02 NOTE — Telephone Encounter (Signed)
-----   Message from Marcine Matar, MD sent at 05/02/2022 12:52 PM EST ----- Please call patient-I sent a prescription for clomiphene-he can take it on Mondays and Thursdays.  I also put orders in for testosterone levels to be checked in 3 months. ----- Message ----- From: Grier Rocher, CMA Sent: 04/28/2022  12:51 PM EST To: Marcine Matar, MD  Spoke with patient, he would like to try the oral rx for testosterone replacement therapy. ----- Message ----- From: Marcine Matar, MD Sent: 04/27/2022  11:14 AM EST To: Grier Rocher, CMA  Tell pt all labs are nml except T level low--ok to sched OV to discuss replacement if he wants ----- Message ----- From: Grier Rocher, CMA Sent: 04/26/2022   4:23 PM EST To: Marcine Matar, MD  Patient would like to try the oral rx clomiphene. ----- Message ----- From: Marcine Matar, MD Sent: 04/25/2022  12:15 PM EST To: Grier Rocher, CMA  Please notify patient that testosterone still low at 257.  It is appropriate to start him on testosterone if he wants.  All other laboratories are normal.  If he knows what type of testosterone replacement he wants-injections, gel or even oral clomiphene, let me know.  Or if he wants to make an appointment to discuss that is fine too. ----- Message ----- From: Grier Rocher, CMA Sent: 04/14/2022   8:46 AM EST To: Marcine Matar, MD  Please review, no f/u scheduled

## 2022-07-19 ENCOUNTER — Encounter: Payer: Self-pay | Admitting: Emergency Medicine

## 2022-07-19 ENCOUNTER — Ambulatory Visit
Admission: EM | Admit: 2022-07-19 | Discharge: 2022-07-19 | Disposition: A | Discharge status: Home / Self Care | Payer: BC Managed Care – PPO | Attending: Nurse Practitioner | Admitting: Nurse Practitioner

## 2022-07-19 DIAGNOSIS — J069 Acute upper respiratory infection, unspecified: Secondary | ICD-10-CM

## 2022-07-19 MED ORDER — PSEUDOEPH-BROMPHEN-DM 30-2-10 MG/5ML PO SYRP: 5.0000 mL | ORAL_SOLUTION | Freq: Four times a day (QID) | ORAL | 0 refills | Status: DC | PRN

## 2022-07-19 MED ORDER — FLUTICASONE PROPIONATE 50 MCG/ACT NA SUSP: 2.0000 | Freq: Every day | NASAL | 0 refills | Status: DC

## 2022-07-19 MED ORDER — CETIRIZINE HCL 10 MG PO TABS: 10.0000 mg | ORAL_TABLET | Freq: Every day | ORAL | 0 refills | Status: DC

## 2022-07-19 NOTE — ED Triage Notes (Signed)
Nasal congestion since Sunday.  Left ear pain,  states congestion feels like it is going down to chest.  Has tried tylenol and thera flu to help with symptoms.

## 2022-07-19 NOTE — Discharge Instructions (Signed)
Take medication as directed. Increase fluids and get plenty of rest. May take over-the-counter ibuprofen or Tylenol as needed for pain, fever, or general discomfort. Recommend normal saline nasal spray to help with nasal congestion throughout the day. For your cough, it may be helpful to use a humidifier at bedtime during sleep. As discussed, if your symptoms do not improve over the next 7 to 10 days, or if they suddenly worsen, please follow-up in this clinic or with your primary care physician for further evaluation. Follow-up as needed.

## 2022-07-19 NOTE — ED Provider Notes (Signed)
[facility]    CSN: [id number] Arrival date & time: 07/19/22  0807      History   Chief Complaint No chief complaint on file.   HPI Chase Carson is a 52 y.o. male.   The history is provided by the patient.   The patient presents with a 3-day history of nasal congestion, headache, sinus pressure, mild cough, and postnasal drainage.  Patient reports he has had intermittent fever, last fever was 1 day ago Tmax 101.  Patient denies sore throat, ear pain, wheezing, shortness of breath, difficulty breathing, or GI symptoms.  Patient denies any obvious known sick contacts.  Patient also denies history of seasonal allergies.  Patient reports he has been taking over-the-counter Tylenol and TheraFlu for his symptoms with minimal relief. Past Medical History:  Diagnosis Date   Hyperlipidemia    Hypertension    Hypothyroidism    Sleep apnea, obstructive     Patient Active Problem List   Diagnosis Date Noted   Low testosterone 03/21/2022   Hypogonadism in male 03/21/2022   Annual physical exam 01/25/2022   Vitiligo 04/28/2020   Diabetes mellitus without complication ([place]) Q000111Q   Hypothyroidism 01/28/2020   Obstructive sleep apnea 02/14/2017   Erectile dysfunction 12/14/2013   Hypertriglyceridemia 11/19/2012   Essential hypertension, benign 11/19/2012    Past Surgical History:  Procedure Laterality Date   None         Home Medications    Prior to Admission medications   Medication Sig Start Date End Date Taking? Authorizing Provider  albuterol (VENTOLIN HFA) 108 (90 Base) MCG/ACT inhaler Inhale 2 puffs into the lungs every 6 (six) hours as needed for wheezing or shortness of breath. 01/24/22   Coral Spikes, DO  clomiPHENE (CLOMID) 50 MG tablet Take 1 tablet p.o. on Mondays and Thursdays 05/02/22   Franchot Gallo, MD  enalapril (VASOTEC) 20 MG tablet Take 1 tablet (20 mg total) by mouth daily. 01/24/22   Coral Spikes, DO  fenofibrate (TRICOR) 145  MG tablet Take 1 tablet (145 mg total) by mouth daily. 01/24/22   Coral Spikes, DO  fluticasone (FLOVENT HFA) 44 MCG/ACT inhaler Inhale 2 puffs by mouth twice daily. 01/24/22   Coral Spikes, DO  levothyroxine (SYNTHROID) 175 MCG tablet Take 1 tablet (175 mcg total) by mouth daily. 03/12/22   Coral Spikes, DO  rosuvastatin (CRESTOR) 10 MG tablet Take 1 tablet (10 mg total) by mouth daily. 03/14/22   Coral Spikes, DO  tadalafil (CIALIS) 20 MG tablet Take 1 tablet (20 mg total) by mouth daily as needed for erectile dysfunction. 01/24/22   Coral Spikes, DO    Family History Family History  Problem Relation Age of Onset   Cancer Father        lung   Peripheral Artery Disease Father    Cancer Sister        breast   Stroke Other     Social History Social History   Tobacco Use   Smoking status: Every Day    Packs/day: 1.00    Years: 32.00    Total pack years: 32.00    Types: Cigarettes   Smokeless tobacco: Never  Vaping Use   Vaping Use: Never used  Substance Use Topics   Alcohol use: Not Currently   Drug use: Never     Allergies   Lopid [gemfibrozil]   Review of Systems Review of Systems Per HPI  Physical Exam Triage Vital Signs ED  Triage Vitals  Enc Vitals Group     BP 07/19/22 0813 (!) 164/95     Pulse Rate 07/19/22 0813 98     Resp 07/19/22 0813 18     Temp 07/19/22 0813 98.9 F (37.2 C)     Temp Source 07/19/22 0813 Oral     SpO2 07/19/22 0813 95 %     Weight --      Height --      Head Circumference --      Peak Flow --      Pain Score 07/19/22 0815 0     Pain Loc --      Pain Edu? --      Excl. in [place]? --    No data found.  Updated Vital Signs BP (!) 164/95 (BP Location: Right Arm)   Pulse 98   Temp 98.9 F (37.2 C) (Oral)   Resp 18   SpO2 95%   Visual Acuity Right Eye Distance:   Left Eye Distance:   Bilateral Distance:    Right Eye Near:   Left Eye Near:    Bilateral Near:     Physical Exam Vitals and nursing note reviewed.   Constitutional:      General: He is not in acute distress.    Appearance: Normal appearance.  HENT:     Head: Normocephalic.     Right Ear: Tympanic membrane, ear canal and external ear normal.     Left Ear: Tympanic membrane, ear canal and external ear normal.     Nose: Congestion present.     Right Turbinates: Enlarged and swollen.     Left Turbinates: Enlarged and swollen.     Right Sinus: Maxillary sinus tenderness and frontal sinus tenderness present.     Left Sinus: Maxillary sinus tenderness and frontal sinus tenderness present.     Mouth/Throat:     Lips: Pink.     Mouth: Mucous membranes are moist.     Pharynx: Uvula midline. Posterior oropharyngeal erythema present. No pharyngeal swelling or oropharyngeal exudate.     Comments: Cobblestoning present on posterior oropharynx Eyes:     Extraocular Movements: Extraocular movements intact.     Conjunctiva/sclera: Conjunctivae normal.     Pupils: Pupils are equal, round, and reactive to light.  Cardiovascular:     Rate and Rhythm: Normal rate and regular rhythm.     Pulses: Normal pulses.     Heart sounds: Normal heart sounds.  Pulmonary:     Effort: Pulmonary effort is normal.     Breath sounds: Normal breath sounds.  Abdominal:     General: Bowel sounds are normal.     Palpations: Abdomen is soft.     Tenderness: There is no abdominal tenderness.  Musculoskeletal:     Cervical back: Normal range of motion.  Lymphadenopathy:     Cervical: No cervical adenopathy.  Skin:    General: Skin is warm and dry.  Neurological:     General: No focal deficit present.     Mental Status: He is alert and oriented to person, place, and time.  Psychiatric:        Mood and Affect: Mood normal.        Behavior: Behavior normal.      UC Treatments / Results  Labs (all labs ordered are listed, but only abnormal results are displayed) Labs Reviewed - No data to display  EKG   Radiology No results  found.  Procedures Procedures (including critical care time)  Medications Ordered in UC Medications - No data to display  Initial Impression / Assessment and Plan / UC Course  I have reviewed the triage vital signs and the nursing notes.  Pertinent labs & imaging results that were available during my care of the patient were reviewed by me and considered in my medical decision making (see chart for details).  The patient is well-appearing, he is in no acute distress, he is hypertensive, but vital signs are otherwise stable.  Suspect a viral upper respiratory infection with cough at this time.  Patient is afebrile, lung sounds are clear throughout, O2 sat at 95% room air.  Will provide symptomatic treatment with Bromfed-DM to help with his cough, fluticasone 50 micro nasal spray for sinus pressure and nasal congestion, and cetirizine 10 mg as an antihistamine.  Discussed viral etiology with the patient and course of symptoms.  Supportive care recommendations were provided to the patient along with indications of when follow-up may be necessary.  Patient is in agreement with this plan of care and verbalizes understanding.  All questions were answered.  Patient stable for discharge.  Work note was provided.   Final Clinical Impressions(s) / UC Diagnoses   Final diagnoses:  None   Discharge Instructions   None    ED Prescriptions   None    PDMP not reviewed this encounter.   Tish Men, NP 07/19/22 [phone]

## 2022-07-20 ENCOUNTER — Telehealth: Payer: Self-pay

## 2022-07-20 NOTE — Telephone Encounter (Signed)
Pt needs blood work order for tomorrow has appt on Tuesday   Pt Chase Carson [phone]

## 2022-07-21 NOTE — Telephone Encounter (Signed)
Pt needs blood work order for tomorrow has appt on Tuesday next week Labs lipid, cmp, tsh , A1c, cbc 02/2022  Pt Chase Carson [phone]

## 2022-07-24 ENCOUNTER — Other Ambulatory Visit: Payer: Self-pay

## 2022-07-24 DIAGNOSIS — E119 Type 2 diabetes mellitus without complications: Secondary | ICD-10-CM

## 2022-07-24 DIAGNOSIS — E781 Pure hyperglyceridemia: Secondary | ICD-10-CM

## 2022-07-24 DIAGNOSIS — E039 Hypothyroidism, unspecified: Secondary | ICD-10-CM

## 2022-07-24 DIAGNOSIS — I10 Essential (primary) hypertension: Secondary | ICD-10-CM

## 2022-07-25 ENCOUNTER — Ambulatory Visit: Payer: BC Managed Care – PPO | Admitting: Family Medicine

## 2022-07-25 VITALS — BP 152/102 | HR 83 | Ht 71.5 in | Wt 277.2 lb

## 2022-07-25 DIAGNOSIS — N529 Male erectile dysfunction, unspecified: Secondary | ICD-10-CM | POA: Diagnosis not present

## 2022-07-25 DIAGNOSIS — E119 Type 2 diabetes mellitus without complications: Secondary | ICD-10-CM

## 2022-07-25 DIAGNOSIS — E781 Pure hyperglyceridemia: Secondary | ICD-10-CM

## 2022-07-25 DIAGNOSIS — I10 Essential (primary) hypertension: Secondary | ICD-10-CM

## 2022-07-25 DIAGNOSIS — J209 Acute bronchitis, unspecified: Secondary | ICD-10-CM | POA: Insufficient documentation

## 2022-07-25 DIAGNOSIS — E039 Hypothyroidism, unspecified: Secondary | ICD-10-CM

## 2022-07-25 HISTORY — DX: Acute bronchitis, unspecified: J20.9

## 2022-07-25 MED ORDER — TADALAFIL 20 MG PO TABS: 20.0000 mg | ORAL_TABLET | Freq: Every day | ORAL | 6 refills | Status: AC | PRN

## 2022-07-25 MED ORDER — AMOXICILLIN-POT CLAVULANATE 875-125 MG PO TABS: 1.0000 | ORAL_TABLET | Freq: Two times a day (BID) | ORAL | 0 refills | Status: DC

## 2022-07-25 NOTE — Progress Notes (Signed)
Subjective:  Patient ID: Chase Carson, male    DOB: 11-Nov-1970  Age: 52 y.o. MRN: [id number]  CC: Chief Complaint  Patient presents with   Follow-up   Sinusitis    Patient is having facial pressure x 10 days.    Nasal Congestion    HPI:  52 year old male with hypertension, OSA, type 2 diabetes, hypothyroidism, hypogonadism, vitiligo, hypertriglyceridemia presents for follow-up.  History initially for routine follow-up.  However, patient is currently sick.  He states that he has been sick since March 1.  He reports initial fever which has now resolved.  He reports ongoing cough, congestion, drainage, and fatigue.  Was recently seen in urgent care and was treated with oral antihistamine, Flonase, and cough medication.  Has had no improvement.  Patient states that he had to stop Crestor due to arthralgias.  He also states that he is now on clomiphene from urology.  Patient Active Problem List   Diagnosis Date Noted   Acute bronchitis 07/25/2022   Hypogonadism in male 03/21/2022   Annual physical exam 01/25/2022   Vitiligo 04/28/2020   Diabetes mellitus without complication ([place]) Q000111Q   Hypothyroidism 01/28/2020   Obstructive sleep apnea 02/14/2017   Erectile dysfunction 12/14/2013   Hypertriglyceridemia 11/19/2012   Essential hypertension, benign 11/19/2012    Social Hx   Social History   Socioeconomic History   Marital status: Married    Spouse name: Not on file   Number of children: Not on file   Years of education: Not on file   Highest education level: Not on file  Occupational History   Not on file  Tobacco Use   Smoking status: Every Day    Packs/day: 1.00    Years: 32.00    Total pack years: 32.00    Types: Cigarettes   Smokeless tobacco: Never  Vaping Use   Vaping Use: Never used  Substance and Sexual Activity   Alcohol use: Not Currently   Drug use: Never   Sexual activity: Yes    Birth control/protection: None  Other Topics Concern    Not on file  Social History Narrative   Not on file   Social Determinants of Health   Financial Resource Strain: Not on file  Food Insecurity: Not on file  Transportation Needs: Not on file  Physical Activity: Not on file  Stress: Not on file  Social Connections: Not on file    Review of Systems Per HPI  Objective:  BP (!) 152/102   Pulse 83   Ht 5' 11.5" (1.816 m)   Wt 277 lb 3.2 oz (125.7 kg)   SpO2 97%   BMI 38.12 kg/m      07/25/2022    9:07 AM 07/25/2022    8:40 AM 07/19/2022    8:13 AM  BP/Weight  Systolic BP 0000000 Q000111Q 123456  Diastolic BP [id number] 95  Wt. (Lbs)  277.2   BMI  38.12 kg/m2     Physical Exam Vitals and nursing note reviewed.  Constitutional:      General: He is not in acute distress.    Appearance: Normal appearance.  HENT:     Head: Normocephalic and atraumatic.     Nose: Congestion present.     Mouth/Throat:     Pharynx: Oropharynx is clear.  Cardiovascular:     Rate and Rhythm: Normal rate and regular rhythm.  Pulmonary:     Effort: Pulmonary effort is normal.     Breath sounds: Normal breath sounds. No  wheezing, rhonchi or rales.  Neurological:     Mental Status: He is alert.  Psychiatric:        Mood and Affect: Mood normal.        Behavior: Behavior normal.     Lab Results  Component Value Date   WBC 11.4 (H) 04/13/2022   HGB 15.5 04/13/2022   HCT 45.7 04/13/2022   PLT 228 04/13/2022   GLUCOSE 152 (H) 01/18/2022   CHOL 180 03/09/2022   TRIG 311 (H) 03/09/2022   HDL 26 (L) 03/09/2022   LDLCALC 101 (H) 03/09/2022   ALT 57 (H) 01/18/2022   AST 38 01/18/2022   NA 137 01/18/2022   K 4.1 01/18/2022   CL 97 01/18/2022   CREATININE 0.78 01/18/2022   BUN 7 01/18/2022   CO2 23 01/18/2022   TSH 6.350 (H) 03/09/2022   HGBA1C 6.6 (H) 01/18/2022     Assessment & Plan:   Problem List Items Addressed This Visit       Cardiovascular and Mediastinum   Essential hypertension, benign    BP elevated today.  Has been previously  well-controlled.  Patient attributes elevation due to illness.  Will continue to monitor closely.      Relevant Medications   tadalafil (CIALIS) 20 MG tablet     Respiratory   Acute bronchitis - Primary    Treating with Augmentin.        Endocrine   Diabetes mellitus without complication ([place])   Relevant Orders   CMP14+EGFR   Hemoglobin A1c   Hypothyroidism   Relevant Orders   TSH     Other   Hypertriglyceridemia   Relevant Medications   tadalafil (CIALIS) 20 MG tablet   Other Relevant Orders   Lipid panel   Erectile dysfunction    Stable.  Medication refilled.      Relevant Medications   tadalafil (CIALIS) 20 MG tablet    Meds ordered this encounter  Medications   tadalafil (CIALIS) 20 MG tablet    Sig: Take 1 tablet (20 mg total) by mouth daily as needed for erectile dysfunction.    Dispense:  20 tablet    Refill:  6   amoxicillin-clavulanate (AUGMENTIN) 875-125 MG tablet    Sig: Take 1 tablet by mouth 2 (two) times daily.    Dispense:  20 tablet    Refill:  0    Follow-up:  3-6 months  [place] DO [place]

## 2022-07-25 NOTE — Assessment & Plan Note (Signed)
BP elevated today.  Has been previously well-controlled.  Patient attributes elevation due to illness.  Will continue to monitor closely.

## 2022-07-25 NOTE — Patient Instructions (Signed)
Antibiotic as prescribed.   Labs when you can.  Follow up in 3-6 months.

## 2022-07-25 NOTE — Assessment & Plan Note (Signed)
Treating with Augmentin. 

## 2022-07-25 NOTE — Assessment & Plan Note (Addendum)
Stable.  Medication refilled 

## 2022-08-01 ENCOUNTER — Other Ambulatory Visit: Payer: Self-pay

## 2022-08-01 ENCOUNTER — Other Ambulatory Visit: Payer: BC Managed Care – PPO

## 2022-08-01 DIAGNOSIS — E781 Pure hyperglyceridemia: Secondary | ICD-10-CM | POA: Diagnosis not present

## 2022-08-01 DIAGNOSIS — E291 Testicular hypofunction: Secondary | ICD-10-CM | POA: Diagnosis not present

## 2022-08-01 DIAGNOSIS — E039 Hypothyroidism, unspecified: Secondary | ICD-10-CM | POA: Diagnosis not present

## 2022-08-01 DIAGNOSIS — I10 Essential (primary) hypertension: Secondary | ICD-10-CM | POA: Diagnosis not present

## 2022-08-01 DIAGNOSIS — R7989 Other specified abnormal findings of blood chemistry: Secondary | ICD-10-CM | POA: Diagnosis not present

## 2022-08-01 DIAGNOSIS — E119 Type 2 diabetes mellitus without complications: Secondary | ICD-10-CM | POA: Diagnosis not present

## 2022-08-02 LAB — COMPREHENSIVE METABOLIC PANEL
ALT: 42 IU/L (ref 0–44)
AST: 29 IU/L (ref 0–40)
Albumin/Globulin Ratio: 2.1 (ref 1.2–2.2)
Albumin: 4.6 g/dL (ref 3.8–4.9)
Alkaline Phosphatase: 87 IU/L (ref 44–121)
BUN/Creatinine Ratio: 11 (ref 9–20)
BUN: 9 mg/dL (ref 6–24)
Bilirubin Total: 1 mg/dL (ref 0.0–1.2)
CO2: 23 mmol/L (ref 20–29)
Calcium: 9.7 mg/dL (ref 8.7–10.2)
Chloride: 103 mmol/L (ref 96–106)
Creatinine, Ser: 0.8 mg/dL (ref 0.76–1.27)
Globulin, Total: 2.2 g/dL (ref 1.5–4.5)
Glucose: 144 mg/dL — ABNORMAL HIGH (ref 70–99)
Potassium: 4.4 mmol/L (ref 3.5–5.2)
Sodium: 142 mmol/L (ref 134–144)
Total Protein: 6.8 g/dL (ref 6.0–8.5)
eGFR: 107 mL/min/{1.73_m2} (ref >59)

## 2022-08-02 LAB — CBC WITH DIFFERENTIAL/PLATELET
Basophils Absolute: 0.1 10*3/uL (ref 0.0–0.2)
Basos: 1 %
EOS (ABSOLUTE): 0.2 10*3/uL (ref 0.0–0.4)
Eos: 2 %
Hematocrit: 52 % — ABNORMAL HIGH (ref 37.5–51.0)
Hemoglobin: 17.2 g/dL (ref 13.0–17.7)
Immature Grans (Abs): 0.1 10*3/uL (ref 0.0–0.1)
Immature Granulocytes: 1 %
Lymphocytes Absolute: 3.1 10*3/uL (ref 0.7–3.1)
Lymphs: 26 %
MCH: 29.3 pg (ref 26.6–33.0)
MCHC: 33.1 g/dL (ref 31.5–35.7)
MCV: 89 fL (ref 79–97)
Monocytes Absolute: 0.7 10*3/uL (ref 0.1–0.9)
Monocytes: 6 %
Neutrophils Absolute: 7.7 10*3/uL — ABNORMAL HIGH (ref 1.4–7.0)
Neutrophils: 64 %
Platelets: 257 10*3/uL (ref 150–450)
RBC: 5.87 x10E6/uL — ABNORMAL HIGH (ref 4.14–5.80)
RDW: 13.3 % (ref 11.6–15.4)
WBC: 12 10*3/uL — ABNORMAL HIGH (ref 3.4–10.8)

## 2022-08-02 LAB — HEMOGLOBIN A1C
Est. average glucose Bld gHb Est-mCnc: 146 mg/dL
Hgb A1c MFr Bld: 6.7 % — ABNORMAL HIGH (ref 4.8–5.6)

## 2022-08-02 LAB — LIPID PANEL
Chol/HDL Ratio: 7.3 ratio — ABNORMAL HIGH (ref 0.0–5.0)
Cholesterol, Total: 169 mg/dL (ref 100–199)
HDL: 23 mg/dL — ABNORMAL LOW (ref >39)
LDL Chol Calc (NIH): 84 mg/dL (ref 0–99)
Triglycerides: 375 mg/dL — ABNORMAL HIGH (ref 0–149)
VLDL Cholesterol Cal: 62 mg/dL — ABNORMAL HIGH (ref 5–40)

## 2022-08-02 LAB — TESTOSTERONE,FREE AND TOTAL
Testosterone, Free: 9.5 pg/mL (ref 7.2–24.0)
Testosterone: 226 ng/dL — ABNORMAL LOW (ref 264–916)

## 2022-08-02 LAB — TSH: TSH: 3.87 u[IU]/mL (ref 0.450–4.500)

## 2022-08-03 ENCOUNTER — Telehealth: Payer: Self-pay

## 2022-08-03 NOTE — Telephone Encounter (Signed)
-----   Message from Franchot Gallo, MD sent at 08/03/2022  1:29 PM EDT ----- Please call pt--T level still low @ 226. Dr Felipa Eth discussed starting on meds @ Nov 23 appt---does pt want to start? ----- Message ----- From: Sherrilyn Rist, CMA Sent: 08/02/2022   2:39 PM EDT To: Franchot Gallo, MD  Please review

## 2022-08-03 NOTE — Telephone Encounter (Signed)
Patient is aware of MD response.  Patient states that Dr. Felipa Eth started  him on Clomid 50 mg on 05/02/22 1 p.o. tablet on Monday and Thursday. Patient voiced understanding

## 2022-08-07 ENCOUNTER — Other Ambulatory Visit: Payer: Self-pay | Admitting: Urology

## 2022-08-07 DIAGNOSIS — R7989 Other specified abnormal findings of blood chemistry: Secondary | ICD-10-CM

## 2022-08-08 ENCOUNTER — Encounter: Payer: Self-pay | Admitting: *Deleted

## 2022-08-08 NOTE — Telephone Encounter (Signed)
  Please call pt--ask him to change to 1/2 tablet daily--I put orders in to recheck T level in 6 weeks  Per Dr. Diona Fanti  Patient called and made aware of prescription change and new lab appt created with patient.

## 2022-09-01 ENCOUNTER — Other Ambulatory Visit: Payer: Self-pay | Admitting: Family Medicine

## 2022-09-01 DIAGNOSIS — E039 Hypothyroidism, unspecified: Secondary | ICD-10-CM

## 2022-09-20 ENCOUNTER — Other Ambulatory Visit: Payer: Self-pay

## 2022-09-20 ENCOUNTER — Other Ambulatory Visit: Payer: BC Managed Care – PPO

## 2022-09-20 DIAGNOSIS — R7989 Other specified abnormal findings of blood chemistry: Secondary | ICD-10-CM | POA: Diagnosis not present

## 2022-09-20 DIAGNOSIS — E291 Testicular hypofunction: Secondary | ICD-10-CM

## 2022-09-23 LAB — TESTOSTERONE,F/WKLYBD+T LC/MS
Testost., % Free&Weakly Bound: 28.5 % (ref 9.0–46.0)
Testost., F+W Bound: 138.2 ng/dL (ref 40.0–250.0)
Testosterone, Total, LC/MS: 485 ng/dL (ref 264.0–916.0)

## 2022-11-25 ENCOUNTER — Other Ambulatory Visit: Payer: Self-pay | Admitting: Family Medicine

## 2022-11-25 DIAGNOSIS — E039 Hypothyroidism, unspecified: Secondary | ICD-10-CM

## 2022-11-28 ENCOUNTER — Encounter: Payer: Self-pay | Admitting: Family Medicine

## 2022-11-28 ENCOUNTER — Ambulatory Visit: Payer: BC Managed Care – PPO | Admitting: Family Medicine

## 2022-11-28 VITALS — BP 178/102 | HR 87 | Temp 98.8°F | Ht 71.5 in | Wt 278.6 lb

## 2022-11-28 DIAGNOSIS — E782 Mixed hyperlipidemia: Secondary | ICD-10-CM | POA: Diagnosis not present

## 2022-11-28 DIAGNOSIS — E039 Hypothyroidism, unspecified: Secondary | ICD-10-CM

## 2022-11-28 DIAGNOSIS — E119 Type 2 diabetes mellitus without complications: Secondary | ICD-10-CM

## 2022-11-28 DIAGNOSIS — D72829 Elevated white blood cell count, unspecified: Secondary | ICD-10-CM

## 2022-11-28 DIAGNOSIS — Z720 Tobacco use: Secondary | ICD-10-CM

## 2022-11-28 DIAGNOSIS — I10 Essential (primary) hypertension: Secondary | ICD-10-CM | POA: Diagnosis not present

## 2022-11-28 NOTE — Patient Instructions (Signed)
Labs today.  Complete the cologuard.  Continue your medications.  Consider CT lung cancer screening.  Follow up in 6 months.

## 2022-11-28 NOTE — Assessment & Plan Note (Signed)
Likely uncontrolled as patient has stopped taking his medication.  Reassessing today.

## 2022-11-28 NOTE — Progress Notes (Signed)
Subjective:  Patient ID: Chase Carson, male    DOB: 1970/12/29  Age: 52 y.o. MRN: [id number]  CC: Chief Complaint  Patient presents with   Follow-up    HPI:  52 year old male with hypertension, OSA, hypothyroidism, type 2 diabetes, hypogonadism, vitiligo, hyperlipidemia presents for follow-up.  Patient reports ongoing fatigue.  He is working 7 days a week now.  Patient continues to smoke.  He is not interested in smoking cessation at this time.  I advised him to consider CT lung cancer screening.  Needs eye exam.  Needs foot exam today.  Patient has Cologuard at home and just has not completed it.  Blood pressure is markedly elevated today.  He has not taken his medication today.  He is on enalapril.  Last A1c was 6.7.  Needs A1c today.  He is not on any pharmacotherapy and is managing with diet and lifestyle.  Patient no longer taking his lipid-lowering therapy.  Needs lipid panel to reassess.  Patient Active Problem List   Diagnosis Date Noted   Mixed hyperlipidemia 11/28/2022   Tobacco abuse 11/28/2022   Hypogonadism in male 03/21/2022   Vitiligo 04/28/2020   Diabetes mellitus without complication ([facility]) 01/28/2020   Hypothyroidism 01/28/2020   Obstructive sleep apnea 02/14/2017   Erectile dysfunction 12/14/2013   Essential hypertension, benign 11/19/2012    Social Hx   Social History   Socioeconomic History   Marital status: Married    Spouse name: Not on file   Number of children: Not on file   Years of education: Not on file   Highest education level: Not on file  Occupational History   Not on file  Tobacco Use   Smoking status: Every Day    Current packs/day: 1.00    Average packs/day: 1 pack/day for 32.0 years (32.0 ttl pk-yrs)    Types: Cigarettes   Smokeless tobacco: Never  Vaping Use   Vaping status: Never Used  Substance and Sexual Activity   Alcohol use: Not Currently   Drug use: Never   Sexual activity: Yes    Birth control/protection:  None  Other Topics Concern   Not on file  Social History Narrative   Not on file   Social Determinants of Health   Financial Resource Strain: Not on file  Food Insecurity: Not on file  Transportation Needs: Not on file  Physical Activity: Not on file  Stress: Not on file  Social Connections: Not on file    Review of Systems  Constitutional:  Positive for fatigue.  Respiratory: Negative.    Cardiovascular: Negative.     Objective:  BP (!) 178/102   Pulse 87   Temp 98.8 F (37.1 C)   Ht 5' 11.5" (1.816 m)   Wt 278 lb 9.6 oz (126.4 kg)   SpO2 96%   BMI 38.32 kg/m      11/28/2022    9:08 AM 07/25/2022    9:07 AM 07/25/2022    8:40 AM  BP/Weight  Systolic BP 178 152 161  Diastolic BP 102 102 103  Wt. (Lbs) 278.6  277.2  BMI 38.32 kg/m2  38.12 kg/m2    Physical Exam Vitals and nursing note reviewed.  Constitutional:      General: He is not in acute distress.    Appearance: Normal appearance.  HENT:     Head: Normocephalic and atraumatic.  Eyes:     General:        Right eye: No discharge.  Left eye: No discharge.     Conjunctiva/sclera: Conjunctivae normal.  Cardiovascular:     Rate and Rhythm: Normal rate and regular rhythm.  Pulmonary:     Effort: Pulmonary effort is normal.     Breath sounds: Normal breath sounds. No wheezing, rhonchi or rales.  Neurological:     Mental Status: He is alert.  Psychiatric:        Mood and Affect: Mood normal.        Behavior: Behavior normal.     Lab Results  Component Value Date   WBC 12.0 (H) 08/01/2022   HGB 17.2 08/01/2022   HCT 52.0 (H) 08/01/2022   PLT 257 08/01/2022   GLUCOSE 144 (H) 08/01/2022   CHOL 169 08/01/2022   TRIG 375 (H) 08/01/2022   HDL 23 (L) 08/01/2022   LDLCALC 84 08/01/2022   ALT 42 08/01/2022   AST 29 08/01/2022   NA 142 08/01/2022   K 4.4 08/01/2022   CL 103 08/01/2022   CREATININE 0.80 08/01/2022   BUN 9 08/01/2022   CO2 23 08/01/2022   TSH 3.870 08/01/2022   HGBA1C 6.7  (H) 08/01/2022     Assessment & Plan:   Problem List Items Addressed This Visit       Cardiovascular and Mediastinum   Essential hypertension, benign - Primary    Uncontrolled.  Patient has not taken his medication today.  Advised compliance.  Labs today.        Endocrine   Diabetes mellitus without complication ([facility])    Has been stable.  A1c today as well as urine microalbumin.      Relevant Orders   CMP14+EGFR   Hemoglobin A1c   Microalbumin / creatinine urine ratio   Hypothyroidism    Reassessing TSH today.  Continue current dosing of Synthroid.      Relevant Orders   TSH     Other   Mixed hyperlipidemia    Likely uncontrolled as patient has stopped taking his medication.  Reassessing today.      Relevant Orders   Lipid panel   Tobacco abuse    Advise smoking cessation.  Recommended CT lung cancer screening.      Other Visit Diagnoses     Leukocytosis, unspecified type       Relevant Orders   CBC       Follow-up:  Return in about 6 months (around 05/31/2023).  Everlene Other DO [facility]

## 2022-11-28 NOTE — Assessment & Plan Note (Signed)
Reassessing TSH today.  Continue current dosing of Synthroid.

## 2022-11-28 NOTE — Assessment & Plan Note (Signed)
Has been stable.  A1c today as well as urine microalbumin.

## 2022-11-28 NOTE — Assessment & Plan Note (Signed)
Advise smoking cessation.  Recommended CT lung cancer screening.

## 2022-11-28 NOTE — Assessment & Plan Note (Signed)
Uncontrolled.  Patient has not taken his medication today.  Advised compliance.  Labs today.

## 2022-11-29 LAB — MICROALBUMIN / CREATININE URINE RATIO
Creatinine, Urine: 120.4 mg/dL
Microalb/Creat Ratio: 9 mg/g creat (ref 0–29)
Microalbumin, Urine: 10.6 ug/mL

## 2022-11-29 LAB — CMP14+EGFR
ALT: 61 IU/L — ABNORMAL HIGH (ref 0–44)
AST: 50 IU/L — ABNORMAL HIGH (ref 0–40)
Albumin: 4.6 g/dL (ref 3.8–4.9)
Alkaline Phosphatase: 78 IU/L (ref 44–121)
BUN/Creatinine Ratio: 13 (ref 9–20)
BUN: 10 mg/dL (ref 6–24)
Bilirubin Total: 0.9 mg/dL (ref 0.0–1.2)
CO2: 24 mmol/L (ref 20–29)
Calcium: 9.3 mg/dL (ref 8.7–10.2)
Chloride: 103 mmol/L (ref 96–106)
Creatinine, Ser: 0.79 mg/dL (ref 0.76–1.27)
Globulin, Total: 2.4 g/dL (ref 1.5–4.5)
Glucose: 135 mg/dL — ABNORMAL HIGH (ref 70–99)
Potassium: 4.2 mmol/L (ref 3.5–5.2)
Sodium: 141 mmol/L (ref 134–144)
Total Protein: 7 g/dL (ref 6.0–8.5)
eGFR: 108 mL/min/{1.73_m2} (ref >59)

## 2022-11-29 LAB — LIPID PANEL
Chol/HDL Ratio: 8.3 ratio — ABNORMAL HIGH (ref 0.0–5.0)
Cholesterol, Total: 190 mg/dL (ref 100–199)
HDL: 23 mg/dL — ABNORMAL LOW (ref >39)
LDL Chol Calc (NIH): 89 mg/dL (ref 0–99)
Triglycerides: 470 mg/dL — ABNORMAL HIGH (ref 0–149)
VLDL Cholesterol Cal: 78 mg/dL — ABNORMAL HIGH (ref 5–40)

## 2022-11-29 LAB — TSH: TSH: 22.2 u[IU]/mL — ABNORMAL HIGH (ref 0.450–4.500)

## 2022-11-29 LAB — CBC
Hematocrit: 49.2 % (ref 37.5–51.0)
Hemoglobin: 16.5 g/dL (ref 13.0–17.7)
MCH: 30.8 pg (ref 26.6–33.0)
MCHC: 33.5 g/dL (ref 31.5–35.7)
MCV: 92 fL (ref 79–97)
Platelets: 217 10*3/uL (ref 150–450)
RBC: 5.36 x10E6/uL (ref 4.14–5.80)
RDW: 13.1 % (ref 11.6–15.4)
WBC: 11.9 10*3/uL — ABNORMAL HIGH (ref 3.4–10.8)

## 2022-11-29 LAB — HEMOGLOBIN A1C
Est. average glucose Bld gHb Est-mCnc: 157 mg/dL
Hgb A1c MFr Bld: 7.1 % — ABNORMAL HIGH (ref 4.8–5.6)

## 2022-12-03 ENCOUNTER — Other Ambulatory Visit: Payer: Self-pay | Admitting: Family Medicine

## 2022-12-03 MED ORDER — METFORMIN HCL 500 MG PO TABS: 500.0000 mg | ORAL_TABLET | Freq: Two times a day (BID) | ORAL | 3 refills | Status: DC

## 2022-12-04 ENCOUNTER — Other Ambulatory Visit: Payer: Self-pay

## 2022-12-04 DIAGNOSIS — E039 Hypothyroidism, unspecified: Secondary | ICD-10-CM

## 2022-12-05 ENCOUNTER — Other Ambulatory Visit: Payer: Self-pay | Admitting: Family Medicine

## 2022-12-05 MED ORDER — ROSUVASTATIN CALCIUM 10 MG PO TABS: 10.0000 mg | ORAL_TABLET | Freq: Every day | ORAL | 3 refills | Status: DC

## 2023-01-12 ENCOUNTER — Other Ambulatory Visit: Payer: Self-pay | Admitting: Family Medicine

## 2023-01-12 DIAGNOSIS — I10 Essential (primary) hypertension: Secondary | ICD-10-CM

## 2023-01-23 ENCOUNTER — Encounter: Payer: Self-pay | Admitting: Family Medicine

## 2023-01-26 DIAGNOSIS — E781 Pure hyperglyceridemia: Secondary | ICD-10-CM | POA: Diagnosis not present

## 2023-01-26 DIAGNOSIS — E119 Type 2 diabetes mellitus without complications: Secondary | ICD-10-CM | POA: Diagnosis not present

## 2023-01-26 DIAGNOSIS — E039 Hypothyroidism, unspecified: Secondary | ICD-10-CM | POA: Diagnosis not present

## 2023-01-29 ENCOUNTER — Ambulatory Visit (INDEPENDENT_AMBULATORY_CARE_PROVIDER_SITE_OTHER): Payer: BC Managed Care – PPO | Admitting: Family Medicine

## 2023-01-29 ENCOUNTER — Encounter: Payer: Self-pay | Admitting: Family Medicine

## 2023-01-29 VITALS — BP 138/90 | HR 96 | Temp 97.9°F | Ht 71.5 in | Wt 278.0 lb

## 2023-01-29 DIAGNOSIS — Z0000 Encounter for general adult medical examination without abnormal findings: Secondary | ICD-10-CM | POA: Diagnosis not present

## 2023-01-29 MED ORDER — LEVOTHYROXINE SODIUM 100 MCG PO TABS: 100.0000 ug | ORAL_TABLET | Freq: Every day | ORAL | 3 refills | Status: DC

## 2023-01-29 MED ORDER — LEVOTHYROXINE SODIUM 88 MCG PO TABS: 88.0000 ug | ORAL_TABLET | Freq: Every day | ORAL | 3 refills | Status: DC

## 2023-01-29 NOTE — Assessment & Plan Note (Signed)
Labs reviewed.  Discussed his preventative health care.  Advised to send in his Cologuard.  Advised to get an eye exam.  He will consider lung cancer screening.  I am increasing his levothyroxine to a total of 188 mcg daily given elevated TSH.

## 2023-01-29 NOTE — Progress Notes (Signed)
Subjective:  Patient ID: MAZEN PHO, male    DOB: 03/01/1971  Age: 52 y.o. MRN: [id number]  CC: Chief Complaint  Patient presents with   Annual Exam    HPI:  52 year old male presents for an annual exam.  BP 138/90 today.  He is compliant with enalapril.  Endorses a lot of stress associated with his job.  Labs reviewed.  Triglycerides and LDL elevated.  Low HDL.  Patient did not tolerate Crestor.  A1c at goal.  TSH was elevated.  Will increase levothyroxine today.  Patient advised to get an eye exam.  He has Cologuard at home and has not sent it in.  Discussed lung cancer screening.  Patient will consider.  Patient Active Problem List   Diagnosis Date Noted   Annual physical exam 01/29/2023   Mixed hyperlipidemia 11/28/2022   Tobacco abuse 11/28/2022   Hypogonadism in male 03/21/2022   Vitiligo 04/28/2020   Diabetes mellitus without complication (HCC) 01/28/2020   Hypothyroidism 01/28/2020   Obstructive sleep apnea 02/14/2017   Erectile dysfunction 12/14/2013   Essential hypertension, benign 11/19/2012    Social Hx   Social History   Socioeconomic History   Marital status: Married    Spouse name: Not on file   Number of children: Not on file   Years of education: Not on file   Highest education level: Not on file  Occupational History   Not on file  Tobacco Use   Smoking status: Every Day    Current packs/day: 1.00    Average packs/day: 1 pack/day for 32.0 years (32.0 ttl pk-yrs)    Types: Cigarettes   Smokeless tobacco: Never  Vaping Use   Vaping status: Never Used  Substance and Sexual Activity   Alcohol use: Not Currently   Drug use: Never   Sexual activity: Yes    Birth control/protection: None  Other Topics Concern   Not on file  Social History Narrative   Not on file   Social Determinants of Health   Financial Resource Strain: Not on file  Food Insecurity: Not on file  Transportation Needs: Not on file  Physical Activity: Not on file   Stress: Not on file  Social Connections: Not on file    Review of Systems  Constitutional: Negative.   Respiratory: Negative.    Cardiovascular: Negative.    Objective:  BP (!) 138/90   Pulse 96   Temp 97.9 F (36.6 C)   Ht 5' 11.5" (1.816 m)   Wt 278 lb (126.1 kg)   SpO2 97%   BMI 38.23 kg/m      01/29/2023    8:43 AM 11/28/2022    9:08 AM 07/25/2022    9:07 AM  BP/Weight  Systolic BP 138 178 152  Diastolic BP 90 102 102  Wt. (Lbs) 278 278.6   BMI 38.23 kg/m2 38.32 kg/m2     Physical Exam Vitals and nursing note reviewed.  Constitutional:      General: He is not in acute distress.    Appearance: Normal appearance.  HENT:     Head: Normocephalic and atraumatic.     Mouth/Throat:     Pharynx: Oropharynx is clear.  Eyes:     General:        Right eye: No discharge.        Left eye: No discharge.     Conjunctiva/sclera: Conjunctivae normal.  Cardiovascular:     Rate and Rhythm: Normal rate and regular rhythm.  Pulmonary:  Effort: Pulmonary effort is normal.     Breath sounds: Normal breath sounds. No wheezing, rhonchi or rales.  Abdominal:     General: There is no distension.     Palpations: Abdomen is soft.     Tenderness: There is no abdominal tenderness.  Neurological:     Mental Status: He is alert.  Psychiatric:        Mood and Affect: Mood normal.        Behavior: Behavior normal.     Lab Results  Component Value Date   WBC 11.9 (H) 11/28/2022   HGB 16.5 11/28/2022   HCT 49.2 11/28/2022   PLT 217 11/28/2022   GLUCOSE 128 (H) 01/26/2023   CHOL 176 01/26/2023   TRIG 318 (H) 01/26/2023   HDL 22 (L) 01/26/2023   LDLCALC 100 (H) 01/26/2023   ALT 72 (H) 01/26/2023   AST 53 (H) 01/26/2023   NA 140 01/26/2023   K 4.6 01/26/2023   CL 101 01/26/2023   CREATININE 0.83 01/26/2023   BUN 10 01/26/2023   CO2 25 01/26/2023   TSH 9.030 (H) 01/26/2023   HGBA1C 6.9 (H) 01/26/2023     Assessment & Plan:   Problem List Items Addressed This  Visit       Other   Annual physical exam - Primary    Labs reviewed.  Discussed his preventative health care.  Advised to send in his Cologuard.  Advised to get an eye exam.  He will consider lung cancer screening.  I am increasing his levothyroxine to a total of 188 mcg daily given elevated TSH.       Meds ordered this encounter  Medications   levothyroxine (SYNTHROID) 100 MCG tablet    Sig: Take 1 tablet (100 mcg total) by mouth daily. Take with 88 mcg for a total of 188 mcg.    Dispense:  90 tablet    Refill:  3   levothyroxine (SYNTHROID) 88 MCG tablet    Sig: Take 1 tablet (88 mcg total) by mouth daily. Take with 100 mcg for a total of 188 mcg.    Dispense:  90 tablet    Refill:  3    Follow-up:  Has scheduled follow up.  Everlene Other DO [facility]

## 2023-01-29 NOTE — Patient Instructions (Signed)
Continue your medications.  I am increasing the Levothyroxine.  Follow up in 6 months.

## 2023-02-19 ENCOUNTER — Ambulatory Visit
Admission: EM | Admit: 2023-02-19 | Discharge: 2023-02-19 | Disposition: A | Discharge status: Home / Self Care | Payer: BC Managed Care – PPO | Attending: Nurse Practitioner | Admitting: Nurse Practitioner

## 2023-02-19 DIAGNOSIS — J0140 Acute pansinusitis, unspecified: Secondary | ICD-10-CM | POA: Diagnosis not present

## 2023-02-19 MED ORDER — AMOXICILLIN-POT CLAVULANATE 875-125 MG PO TABS: 1.0000 | ORAL_TABLET | Freq: Two times a day (BID) | ORAL | 0 refills | Status: DC

## 2023-02-19 MED ORDER — AZELASTINE HCL 0.1 % NA SOLN: 2.0000 | Freq: Two times a day (BID) | NASAL | 0 refills | Status: DC

## 2023-02-19 NOTE — Discharge Instructions (Addendum)
Take medication as directed. Continue your current allergy medication regimen. You may continue Sudafed as needed.  Purchase over-the-counter Ayrs Saline Gel to help prevent nosebleeds.  You may also apply petroleum jelly inside of the nostrils to help slow bleeding. Increase fluids and get plenty of rest. May take over-the-counter ibuprofen or Tylenol as needed for pain, fever, or general discomfort. Recommend normal saline nasal spray to help with nasal congestion throughout the day. For your cough, it may be helpful to use a humidifier at bedtime during sleep. If you develop a nosebleed that you are unable to stop, please go to the emergency department immediately for further evaluation. If symptoms do not improve with this treatment, please follow-up with your primary care physician or in this clinic for further evaluation.

## 2023-02-19 NOTE — ED Triage Notes (Signed)
Sinus pain and pressure x 4 days, cough x 1 day with bloody nose yesterday. Taking tylenol cold and flu.

## 2023-02-19 NOTE — ED Provider Notes (Signed)
[facility]    CSN: [id number] Arrival date & time: 02/19/23  0804      History   Chief Complaint Chief Complaint  Patient presents with   Facial Pain    HPI Chase Carson is a 52 y.o. male.   The history is provided by the patient.   Patient presents with a 4-day history of headache, facial pressure, sinus pain, nasal drainage, and nosebleeds.  Patient denies fever, chills, sore throat, ear pain, wheezing, difficulty breathing, chest pain, abdominal pain, nausea, vomiting, or diarrhea.  Patient reports 2 nosebleeds.  States that he had 1 last evening that lasted almost 1 hour.  He states he did have 1 this morning, but was able to get it to stop.  Patient denies history of blood thinner use.  Patient reports he has been taking over-the-counter cough and cold medication for his symptoms. Past Medical History:  Diagnosis Date   Acute bronchitis 07/25/2022   Hyperlipidemia    Hypertension    Hypothyroidism    Sleep apnea, obstructive     Patient Active Problem List   Diagnosis Date Noted   Annual physical exam 01/29/2023   Mixed hyperlipidemia 11/28/2022   Tobacco abuse 11/28/2022   Hypogonadism in male 03/21/2022   Vitiligo 04/28/2020   Diabetes mellitus without complication (HCC) 01/28/2020   Hypothyroidism 01/28/2020   Obstructive sleep apnea 02/14/2017   Erectile dysfunction 12/14/2013   Essential hypertension, benign 11/19/2012    Past Surgical History:  Procedure Laterality Date   None         Home Medications    Prior to Admission medications   Medication Sig Start Date End Date Taking? Authorizing Provider  amoxicillin-clavulanate (AUGMENTIN) 875-125 MG tablet Take 1 tablet by mouth every 12 (twelve) hours. 02/19/23  Yes Anjelita Sheahan-Warren, Sadie Haber, NP  azelastine (ASTELIN) 0.1 % nasal spray Place 2 sprays into both nostrils 2 (two) times daily. Use in each nostril as directed 02/19/23  Yes Sholanda Croson-Warren, Sadie Haber, NP  clomiPHENE (CLOMID)  50 MG tablet Take 1 tablet p.o. on Mondays and Thursdays 05/02/22  Yes Dahlstedt, Jeannett Senior, MD  enalapril (VASOTEC) 20 MG tablet Take 1 tablet by mouth once daily 01/16/23  Yes Cook, Jayce G, DO  levothyroxine (SYNTHROID) 100 MCG tablet Take 1 tablet (100 mcg total) by mouth daily. Take with 88 mcg for a total of 188 mcg. 01/29/23  Yes Cook, Jayce G, DO  levothyroxine (SYNTHROID) 88 MCG tablet Take 1 tablet (88 mcg total) by mouth daily. Take with 100 mcg for a total of 188 mcg. 01/29/23  Yes Cook, Jayce G, DO  albuterol (VENTOLIN HFA) 108 (90 Base) MCG/ACT inhaler Inhale 2 puffs into the lungs every 6 (six) hours as needed for wheezing or shortness of breath. 01/24/22   Tommie Sams, DO  fluticasone (FLOVENT HFA) 44 MCG/ACT inhaler Inhale 2 puffs by mouth twice daily. 01/24/22   Tommie Sams, DO  metFORMIN (GLUCOPHAGE) 500 MG tablet Take 1 tablet (500 mg total) by mouth 2 (two) times daily with a meal. 12/03/22   Cook, Verdis Frederickson, DO  tadalafil (CIALIS) 20 MG tablet Take 1 tablet (20 mg total) by mouth daily as needed for erectile dysfunction. 07/25/22   Tommie Sams, DO    Family History Family History  Problem Relation Age of Onset   Cancer Father        lung   Peripheral Artery Disease Father    Cancer Sister        breast  Stroke Other     Social History Social History   Tobacco Use   Smoking status: Every Day    Current packs/day: 1.00    Average packs/day: 1 pack/day for 32.0 years (32.0 ttl pk-yrs)    Types: Cigarettes   Smokeless tobacco: Never  Vaping Use   Vaping status: Never Used  Substance Use Topics   Alcohol use: Not Currently   Drug use: Never     Allergies   Lopid [gemfibrozil]   Review of Systems Review of Systems Per HPI  Physical Exam Triage Vital Signs ED Triage Vitals [02/19/23 0826]  Encounter Vitals Group     BP (!) 163/108     Systolic BP Percentile      Diastolic BP Percentile      Pulse Rate (!) 103     Resp 18     Temp 99.2 F (37.3 C)      Temp Source Oral     SpO2 96 %     Weight      Height      Head Circumference      Peak Flow      Pain Score 6     Pain Loc      Pain Education      Exclude from Growth Chart    No data found.  Updated Vital Signs BP (!) 163/108 (BP Location: Right Arm)   Pulse (!) 103   Temp 99.2 F (37.3 C) (Oral)   Resp 18   SpO2 96%   Visual Acuity Right Eye Distance:   Left Eye Distance:   Bilateral Distance:    Right Eye Near:   Left Eye Near:    Bilateral Near:     Physical Exam Vitals and nursing note reviewed.  Constitutional:      General: He is not in acute distress.    Appearance: Normal appearance.  HENT:     Head: Normocephalic.     Right Ear: Tympanic membrane, ear canal and external ear normal.     Left Ear: Tympanic membrane, ear canal and external ear normal.     Nose: Congestion present.     Right Turbinates: Enlarged and swollen.     Left Turbinates: Enlarged and swollen.     Right Sinus: Maxillary sinus tenderness and frontal sinus tenderness present.     Left Sinus: Maxillary sinus tenderness and frontal sinus tenderness present.     Mouth/Throat:     Lips: Pink.     Mouth: Mucous membranes are moist.     Pharynx: Uvula midline. Posterior oropharyngeal erythema and postnasal drip present. No pharyngeal swelling, oropharyngeal exudate or uvula swelling.  Eyes:     Extraocular Movements: Extraocular movements intact.     Conjunctiva/sclera: Conjunctivae normal.     Pupils: Pupils are equal, round, and reactive to light.  Cardiovascular:     Rate and Rhythm: Regular rhythm. Tachycardia present.     Pulses: Normal pulses.     Heart sounds: Normal heart sounds.  Pulmonary:     Effort: Pulmonary effort is normal.     Breath sounds: Normal breath sounds.  Abdominal:     General: Bowel sounds are normal.     Palpations: Abdomen is soft.     Tenderness: There is no abdominal tenderness.  Musculoskeletal:     Cervical back: Normal range of motion.   Lymphadenopathy:     Cervical: No cervical adenopathy.  Skin:    General: Skin is warm and dry.  Neurological:  General: No focal deficit present.     Mental Status: He is alert and oriented to person, place, and time.  Psychiatric:        Mood and Affect: Mood normal.        Behavior: Behavior normal.      UC Treatments / Results  Labs (all labs ordered are listed, but only abnormal results are displayed) Labs Reviewed - No data to display  EKG   Radiology No results found.  Procedures Procedures (including critical care time)  Medications Ordered in UC Medications - No data to display  Initial Impression / Assessment and Plan / UC Course  I have reviewed the triage vital signs and the nursing notes.  Pertinent labs & imaging results that were available during my care of the patient were reviewed by me and considered in my medical decision making (see chart for details).  The patient is well-appearing, he is in no acute distress, he is hypertensive and mildly tachycardic.  Patient does have a history of hypertension.  Patient advised to take medication as prescribed.  With regard to the upper respiratory symptoms, given the recurrent nosebleeds, and erythema and swelling of the turbinates seen on exam, we will go ahead and treat for acute sinusitis with Augmentin 875/125 mg tablets for 7 days.  Patient was also prescribed azelastine nasal spray for nasal congestion, defer use of steroid nasal spray such as Flonase due to patient's recurrent nosebleeds.  Supportive care recommendations were provided and discussed with the patient to include over-the-counter analgesics, use of saline gel for nosebleeds, and normal saline nasal spray for nasal congestion.  Patient was given strict follow-up precautions along with ER follow-up precautions.  Patient is in agreement with this plan of care and verbalizes understanding.  All questions were answered.  Patient stable for discharge.   Work note was provided.  Final Clinical Impressions(s) / UC Diagnoses   Final diagnoses:  Acute pansinusitis, recurrence not specified     Discharge Instructions      Take medication as directed. Continue your current allergy medication regimen. You may continue Sudafed as needed.  Purchase over-the-counter Ayrs Saline Gel to help prevent nosebleeds.  You may also apply petroleum jelly inside of the nostrils to help slow bleeding. Increase fluids and get plenty of rest. May take over-the-counter ibuprofen or Tylenol as needed for pain, fever, or general discomfort. Recommend normal saline nasal spray to help with nasal congestion throughout the day. For your cough, it may be helpful to use a humidifier at bedtime during sleep. If you develop a nosebleed that you are unable to stop, please go to the emergency department immediately for further evaluation. If symptoms do not improve with this treatment, please follow-up with your primary care physician or in this clinic for further evaluation.     ED Prescriptions     Medication Sig Dispense Auth. Provider   amoxicillin-clavulanate (AUGMENTIN) 875-125 MG tablet Take 1 tablet by mouth every 12 (twelve) hours. 14 tablet Yulieth Carrender-Warren, Sadie Haber, NP   azelastine (ASTELIN) 0.1 % nasal spray Place 2 sprays into both nostrils 2 (two) times daily. Use in each nostril as directed 30 mL Chi Woodham-Warren, Sadie Haber, NP      PDMP not reviewed this encounter.   Abran Cantor, NP 02/19/23 [phone]

## 2023-05-04 ENCOUNTER — Other Ambulatory Visit: Payer: Self-pay | Admitting: Urology

## 2023-05-04 ENCOUNTER — Telehealth: Payer: Self-pay

## 2023-05-04 DIAGNOSIS — R7989 Other specified abnormal findings of blood chemistry: Secondary | ICD-10-CM

## 2023-05-04 NOTE — Telephone Encounter (Signed)
-----   Message from Bertram Millard Dahlstedt sent at 05/04/2023 12:49 PM EST ----- I refilled scrip for 1 month--he needs OV

## 2023-05-04 NOTE — Telephone Encounter (Signed)
Tried calling patient with no answer, left vm for return call to office. Hours left on patient vm.

## 2023-05-14 ENCOUNTER — Telehealth: Payer: Self-pay

## 2023-05-14 NOTE — Telephone Encounter (Signed)
Appt scheduled for 2/17. Pt still hasn't returned phone call.

## 2023-05-14 NOTE — Telephone Encounter (Signed)
PA started: (Key: [id number])

## 2023-05-31 ENCOUNTER — Ambulatory Visit: Payer: BC Managed Care – PPO | Admitting: Family Medicine

## 2023-05-31 DIAGNOSIS — J988 Other specified respiratory disorders: Secondary | ICD-10-CM | POA: Insufficient documentation

## 2023-05-31 DIAGNOSIS — E039 Hypothyroidism, unspecified: Secondary | ICD-10-CM | POA: Diagnosis not present

## 2023-05-31 DIAGNOSIS — I10 Essential (primary) hypertension: Secondary | ICD-10-CM

## 2023-05-31 DIAGNOSIS — Z130 Encounter for screening for diseases of the blood and blood-forming organs and certain disorders involving the immune mechanism: Secondary | ICD-10-CM | POA: Diagnosis not present

## 2023-05-31 DIAGNOSIS — E1169 Type 2 diabetes mellitus with other specified complication: Secondary | ICD-10-CM

## 2023-05-31 DIAGNOSIS — E119 Type 2 diabetes mellitus without complications: Secondary | ICD-10-CM

## 2023-05-31 DIAGNOSIS — E782 Mixed hyperlipidemia: Secondary | ICD-10-CM | POA: Diagnosis not present

## 2023-05-31 MED ORDER — PROMETHAZINE-DM 6.25-15 MG/5ML PO SYRP: 5.0000 mL | ORAL_SOLUTION | Freq: Four times a day (QID) | ORAL | 0 refills | Status: DC | PRN

## 2023-05-31 MED ORDER — AMOXICILLIN-POT CLAVULANATE 875-125 MG PO TABS: 1.0000 | ORAL_TABLET | Freq: Two times a day (BID) | ORAL | 0 refills | Status: DC

## 2023-05-31 NOTE — Progress Notes (Signed)
Subjective:  Patient ID: Chase Carson, male    DOB: Sep 07, 1970  Age: 53 y.o. MRN: [id number]  CC:   Chief Complaint  Patient presents with   Hypertension   Cough    Since and sinus drainage since Friday- pt uses CPAP   Diabetes    Not taking metformin due to causing joint pain     HPI:  53 year old male with the below mentioned medical problems presents for follow-up.  BP mildly elevated here today.  He attributes this to current illness.  He is compliant with enalapril.  Patient reports ongoing respiratory symptoms.  Started Friday.  Worsening.  Persistent cough and congestion.  He is a current smoker.  Patient defers preventative healthcare items at this time including CT lung cancer screening.  Needs labs.  Last A1c 6.9.  He is no longer taking metformin as he states that it gives him a joint pain.  Lipids uncontrolled.  He has not tolerated lipid-lowering agents in the past.  Patient Active Problem List   Diagnosis Date Noted   Respiratory infection 05/31/2023   Annual physical exam 01/29/2023   Mixed hyperlipidemia 11/28/2022   Tobacco abuse 11/28/2022   Hypogonadism in male 03/21/2022   Vitiligo 04/28/2020   Diabetes mellitus without complication (HCC) 01/28/2020   Hypothyroidism 01/28/2020   Obstructive sleep apnea 02/14/2017   Erectile dysfunction 12/14/2013   Essential hypertension, benign 11/19/2012    Social Hx   Social History   Socioeconomic History   Marital status: Married    Spouse name: Not on file   Number of children: Not on file   Years of education: Not on file   Highest education level: Some college, no degree  Occupational History   Not on file  Tobacco Use   Smoking status: Every Day    Current packs/day: 1.00    Average packs/day: 1 pack/day for 32.0 years (32.0 ttl pk-yrs)    Types: Cigarettes   Smokeless tobacco: Never  Vaping Use   Vaping status: Never Used  Substance and Sexual Activity   Alcohol use: Not Currently    Drug use: Never   Sexual activity: Yes    Birth control/protection: None  Other Topics Concern   Not on file  Social History Narrative   Not on file   Social Drivers of Health   Financial Resource Strain: Low Risk  (05/27/2023)   Overall Financial Resource Strain (CARDIA)    Difficulty of Paying Living Expenses: Not very hard  Food Insecurity: No Food Insecurity (05/27/2023)   Hunger Vital Sign    Worried About Running Out of Food in the Last Year: Never true    Ran Out of Food in the Last Year: Never true  Transportation Needs: No Transportation Needs (05/27/2023)   PRAPARE - Administrator, Civil Service (Medical): No    Lack of Transportation (Non-Medical): No  Physical Activity: Insufficiently Active (05/27/2023)   Exercise Vital Sign    Days of Exercise per Week: 1 day    Minutes of Exercise per Session: 10 min  Stress: Stress Concern Present (05/27/2023)   [employer] of Occupational Health - Occupational Stress Questionnaire    Feeling of Stress : To some extent  Social Connections: Socially Isolated (05/27/2023)   Social Connection and Isolation Panel [NHANES]    Frequency of Communication with Friends and Family: Once a week    Frequency of Social Gatherings with Friends and Family: Never    Attends Religious Services: Never  Active Member of Clubs or Organizations: No    Attends Engineer, structural: Not on file    Marital Status: Married    Review of Systems Per HPI  Objective:  BP (!) 150/84   Pulse 89   Temp 98.2 F (36.8 C)   Ht 5' 11.5" (1.816 m)   Wt 273 lb (123.8 kg)   SpO2 98%   BMI 37.55 kg/m      05/31/2023    9:34 AM 05/31/2023    9:26 AM 02/19/2023    8:26 AM  BP/Weight  Systolic BP 150 146 163  Diastolic BP 84 92 108  Wt. (Lbs)  273   BMI  37.55 kg/m2     Physical Exam Vitals and nursing note reviewed.  Constitutional:      General: He is not in acute distress.    Appearance: Normal appearance.  HENT:      Head: Normocephalic and atraumatic.  Eyes:     General:        Right eye: No discharge.        Left eye: No discharge.     Conjunctiva/sclera: Conjunctivae normal.  Cardiovascular:     Rate and Rhythm: Normal rate and regular rhythm.  Pulmonary:     Effort: Pulmonary effort is normal.     Breath sounds: Normal breath sounds. No wheezing, rhonchi or rales.  Neurological:     Mental Status: He is alert.  Psychiatric:        Mood and Affect: Mood normal.        Behavior: Behavior normal.     Lab Results  Component Value Date   WBC 11.9 (H) 11/28/2022   HGB 16.5 11/28/2022   HCT 49.2 11/28/2022   PLT 217 11/28/2022   GLUCOSE 128 (H) 01/26/2023   CHOL 176 01/26/2023   TRIG 318 (H) 01/26/2023   HDL 22 (L) 01/26/2023   LDLCALC 100 (H) 01/26/2023   ALT 72 (H) 01/26/2023   AST 53 (H) 01/26/2023   NA 140 01/26/2023   K 4.6 01/26/2023   CL 101 01/26/2023   CREATININE 0.83 01/26/2023   BUN 10 01/26/2023   CO2 25 01/26/2023   TSH 9.030 (H) 01/26/2023   HGBA1C 6.9 (H) 01/26/2023     Assessment & Plan:   Problem List Items Addressed This Visit       Cardiovascular and Mediastinum   Essential hypertension, benign   BP mildly elevated.  Will continue to monitor.  Continue current medication.      Relevant Orders   CMP14+EGFR     Respiratory   Respiratory infection   Treating with Augmentin and Promethazine DM.        Endocrine   Hypothyroidism   Relevant Orders   TSH   Diabetes mellitus without complication (HCC)   A1c to assess.  Metformin has been discontinued due to side effects.      Relevant Orders   Hemoglobin A1c   Microalbumin / creatinine urine ratio     Other   Mixed hyperlipidemia   Reassessing today.  Will likely need PCSK9 as he has not tolerated statin.       Relevant Orders   Lipid panel   Other Visit Diagnoses       Screening for deficiency anemia       Relevant Orders   CBC       Meds ordered this encounter  Medications    amoxicillin-clavulanate (AUGMENTIN) 875-125 MG tablet    Sig: Take  1 tablet by mouth 2 (two) times daily.    Dispense:  14 tablet    Refill:  0   promethazine-dextromethorphan (PROMETHAZINE-DM) 6.25-15 MG/5ML syrup    Sig: Take 5 mLs by mouth 4 (four) times daily as needed for cough.    Dispense:  118 mL    Refill:  0    Follow-up:  Return in about 3 months (around 08/29/2023).  Everlene Other DO [facility]

## 2023-05-31 NOTE — Assessment & Plan Note (Signed)
A1c to assess.  Metformin has been discontinued due to side effects.

## 2023-05-31 NOTE — Assessment & Plan Note (Signed)
Treating with Augmentin and Promethazine DM. 

## 2023-05-31 NOTE — Assessment & Plan Note (Signed)
BP mildly elevated.  Will continue to monitor.  Continue current medication.

## 2023-05-31 NOTE — Patient Instructions (Signed)
Labs today.  Meds as prescribed regarding respiratory illness.  Follow up in 3 months.

## 2023-05-31 NOTE — Assessment & Plan Note (Signed)
Reassessing today.  Will likely need PCSK9 as he has not tolerated statin.

## 2023-06-02 LAB — MICROALBUMIN / CREATININE URINE RATIO
Creatinine, Urine: 111.7 mg/dL
Microalb/Creat Ratio: 9 mg/g{creat} (ref 0–29)
Microalbumin, Urine: 9.6 ug/mL

## 2023-06-02 LAB — CMP14+EGFR
ALT: 58 [IU]/L — ABNORMAL HIGH (ref 0–44)
AST: 41 [IU]/L — ABNORMAL HIGH (ref 0–40)
Albumin: 4.6 g/dL (ref 3.8–4.9)
Alkaline Phosphatase: 87 [IU]/L (ref 44–121)
BUN/Creatinine Ratio: 11 (ref 9–20)
BUN: 9 mg/dL (ref 6–24)
Bilirubin Total: 1 mg/dL (ref 0.0–1.2)
CO2: 24 mmol/L (ref 20–29)
Calcium: 9.8 mg/dL (ref 8.7–10.2)
Chloride: 100 mmol/L (ref 96–106)
Creatinine, Ser: 0.81 mg/dL (ref 0.76–1.27)
Globulin, Total: 2.4 g/dL (ref 1.5–4.5)
Glucose: 128 mg/dL — ABNORMAL HIGH (ref 70–99)
Potassium: 4.6 mmol/L (ref 3.5–5.2)
Sodium: 140 mmol/L (ref 134–144)
Total Protein: 7 g/dL (ref 6.0–8.5)
eGFR: 107 mL/min/{1.73_m2} (ref >59)

## 2023-06-02 LAB — LIPID PANEL
Chol/HDL Ratio: 7.4 {ratio} — ABNORMAL HIGH (ref 0.0–5.0)
Cholesterol, Total: 171 mg/dL (ref 100–199)
HDL: 23 mg/dL — ABNORMAL LOW (ref >39)
LDL Chol Calc (NIH): 97 mg/dL (ref 0–99)
Triglycerides: 298 mg/dL — ABNORMAL HIGH (ref 0–149)
VLDL Cholesterol Cal: 51 mg/dL — ABNORMAL HIGH (ref 5–40)

## 2023-06-02 LAB — CBC
Hematocrit: 47.9 % (ref 37.5–51.0)
Hemoglobin: 16.4 g/dL (ref 13.0–17.7)
MCH: 30.8 pg (ref 26.6–33.0)
MCHC: 34.2 g/dL (ref 31.5–35.7)
MCV: 90 fL (ref 79–97)
Platelets: 262 10*3/uL (ref 150–450)
RBC: 5.32 x10E6/uL (ref 4.14–5.80)
RDW: 12.3 % (ref 11.6–15.4)
WBC: 12.7 10*3/uL — ABNORMAL HIGH (ref 3.4–10.8)

## 2023-06-02 LAB — TSH: TSH: 3.28 u[IU]/mL (ref 0.450–4.500)

## 2023-06-02 LAB — HEMOGLOBIN A1C
Est. average glucose Bld gHb Est-mCnc: 154 mg/dL
Hgb A1c MFr Bld: 7 % — ABNORMAL HIGH (ref 4.8–5.6)

## 2023-06-03 ENCOUNTER — Encounter: Payer: Self-pay | Admitting: Family Medicine

## 2023-07-02 ENCOUNTER — Ambulatory Visit: Payer: BC Managed Care – PPO | Admitting: Urology

## 2023-07-02 VITALS — BP 168/105 | HR 103

## 2023-07-02 DIAGNOSIS — N5201 Erectile dysfunction due to arterial insufficiency: Secondary | ICD-10-CM

## 2023-07-02 DIAGNOSIS — R7989 Other specified abnormal findings of blood chemistry: Secondary | ICD-10-CM | POA: Diagnosis not present

## 2023-07-02 LAB — URINALYSIS, ROUTINE W REFLEX MICROSCOPIC
Bilirubin, UA: NEGATIVE
Glucose, UA: NEGATIVE
Ketones, UA: NEGATIVE
Leukocytes,UA: NEGATIVE
Nitrite, UA: NEGATIVE
Protein,UA: NEGATIVE
RBC, UA: NEGATIVE
Specific Gravity, UA: 1.015 (ref 1.005–1.030)
Urobilinogen, Ur: 0.2 mg/dL (ref 0.2–1.0)
pH, UA: 7 (ref 5.0–7.5)

## 2023-07-02 MED ORDER — CLOMIPHENE CITRATE 50 MG PO TABS
ORAL_TABLET | ORAL | 3 refills | Status: DC
Start: 2023-07-02 — End: 2023-12-31

## 2023-07-02 NOTE — Progress Notes (Signed)
 07/02/2023 9:52 AM   Chase Carson June 30, 1970 [id number]  Referring provider: Tommie Sams, DO [address] [place],  [place] [address]  No chief complaint on file.   HPI:  New pt for me --   1) low T - saw Drs. Stoneking and Dahlstedt -  March 2020 for T226.  November 2023 T 257, PSA 1, hematocrit 45.7, LH 6.3, P 5.0. Prescription for Clomid.  1 tab Monday, Thursday.  May 2024 T 485.  2) ED - since 2014. Tried pde5i and ICI.   Today, seen for the above. Was taking clomiphene 1 tab Mon and Thurs and then increased to 1/2 tab daily. Ran out last week. Jan 2025 Hct 47.9. Takes cialis.   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 [employer]. [employer].   PMH: Past Medical History:  Diagnosis Date   Acute bronchitis 07/25/2022   Hyperlipidemia    Hypertension    Hypothyroidism    Sleep apnea, obstructive     Surgical History: Past Surgical History:  Procedure Laterality Date   None      Home Medications:  Allergies as of 07/02/2023       Reactions   Lopid [gemfibrozil] Other (See Comments)   Joint aches.        Medication List        Accurate as of July 02, 2023  9:52 AM. If you have any questions, ask your nurse or doctor.          albuterol 108 (90 Base) MCG/ACT inhaler Commonly known as: VENTOLIN HFA Inhale 2 puffs into the lungs every 6 (six) hours as needed for wheezing or shortness of breath.   amoxicillin-clavulanate 875-125 MG tablet Commonly known as: AUGMENTIN Take 1 tablet by mouth 2 (two) times daily.   clomiPHENE 50 MG tablet Commonly known as: Clomid TAKE 1 TABLET BY MOUTH ON MONDAYS AND THURSDAYS.   enalapril 20 MG tablet Commonly known as: VASOTEC Take 1 tablet by mouth once daily   fluticasone 44 MCG/ACT inhaler Commonly known as: Flovent HFA Inhale 2 puffs by mouth twice daily.   levothyroxine 100 MCG tablet Commonly known as: SYNTHROID Take 1 tablet (100 mcg total) by mouth daily.  Take with 88 mcg for a total of 188 mcg.   levothyroxine 88 MCG tablet Commonly known as: SYNTHROID Take 1 tablet (88 mcg total) by mouth daily. Take with 100 mcg for a total of 188 mcg.   promethazine-dextromethorphan 6.25-15 MG/5ML syrup Commonly known as: PROMETHAZINE-DM Take 5 mLs by mouth 4 (four) times daily as needed for cough.   tadalafil 20 MG tablet Commonly known as: CIALIS Take 1 tablet (20 mg total) by mouth daily as needed for erectile dysfunction.        Allergies:  Allergies  Allergen Reactions   Lopid [Gemfibrozil] Other (See Comments)    Joint aches.    Family History: Family History  Problem Relation Age of Onset   Cancer Father        lung   Peripheral Artery Disease Father    Cancer Sister        breast   Stroke Other     Social History:  reports that he has been smoking cigarettes. He has a 32 pack-year smoking history. He has never used smokeless tobacco. He reports that he does not currently use alcohol. He reports that he does not use drugs.   Physical Exam: BP (!) 168/105  Pulse (!) 103   Constitutional:  Alert and oriented, No acute distress. HEENT: [place] AT, moist mucus membranes.  Trachea midline, no masses. Cardiovascular: No clubbing, cyanosis, or edema. Respiratory: Normal respiratory effort, no increased work of breathing. GI: Abdomen is soft, nontender, nondistended, no abdominal masses GU: No CVA tenderness Skin: No rashes, bruises or suspicious lesions. Neurologic: Grossly intact, no focal deficits, moving all 4 extremities. Psychiatric: Normal mood and affect.  Laboratory Data: Lab Results  Component Value Date   WBC 12.7 (H) 05/31/2023   HGB 16.4 05/31/2023   HCT 47.9 05/31/2023   MCV 90 05/31/2023   PLT 262 05/31/2023    Lab Results  Component Value Date   CREATININE 0.81 05/31/2023    No results found for: "PSA"  Lab Results  Component Value Date   TESTOSTERONE 226 (L) 08/01/2022    Lab Results  Component  Value Date   HGBA1C 7.0 (H) 05/31/2023    Urinalysis    Component Value Date/Time   COLORURINE YELLOW 02/13/2010 1355   APPEARANCEUR Hazy (A) 03/21/2022 1525   LABSPEC >1.030 (H) 02/13/2010 1355   PHURINE 6.0 02/13/2010 1355   GLUCOSEU Negative 03/21/2022 1525   HGBUR NEGATIVE 02/13/2010 1355   BILIRUBINUR Negative 03/21/2022 1525   KETONESUR TRACE (A) 02/13/2010 1355   PROTEINUR Negative 03/21/2022 1525   PROTEINUR NEGATIVE 02/13/2010 1355   UROBILINOGEN 0.2 02/13/2010 1355   NITRITE Negative 03/21/2022 1525   NITRITE NEGATIVE 02/13/2010 1355   LEUKOCYTESUR Negative 03/21/2022 1525    Lab Results  Component Value Date   LABMICR 9.6 05/31/2023    Pertinent Imaging: N/a   Assessment & Plan:    1) low T - send labs and refill clomid 1/2 tab daily.   2) ED - not as sex active right now. Discussed options.   No follow-ups on file.  Jerilee Field, MD  [facility]  [address] [place], [place] [address] [phone]

## 2023-07-03 LAB — PSA: Prostate Specific Ag, Serum: 1.3 ng/mL (ref 0.0–4.0)

## 2023-07-03 LAB — TESTOSTERONE: Testosterone: 528 ng/dL (ref 264–916)

## 2023-07-18 ENCOUNTER — Other Ambulatory Visit: Payer: Self-pay | Admitting: Family Medicine

## 2023-07-18 DIAGNOSIS — I10 Essential (primary) hypertension: Secondary | ICD-10-CM

## 2023-08-02 ENCOUNTER — Encounter: Payer: Self-pay | Admitting: Emergency Medicine

## 2023-08-02 ENCOUNTER — Ambulatory Visit
Admission: EM | Admit: 2023-08-02 | Discharge: 2023-08-02 | Disposition: A | Discharge status: Home / Self Care | Attending: Nurse Practitioner | Admitting: Nurse Practitioner

## 2023-08-02 DIAGNOSIS — J019 Acute sinusitis, unspecified: Secondary | ICD-10-CM

## 2023-08-02 LAB — POC COVID19/FLU A&B COMBO
Covid Antigen, POC: NEGATIVE
Influenza A Antigen, POC: NEGATIVE
Influenza B Antigen, POC: NEGATIVE

## 2023-08-02 MED ORDER — AMOXICILLIN-POT CLAVULANATE 875-125 MG PO TABS: 1.0000 | ORAL_TABLET | Freq: Two times a day (BID) | ORAL | 0 refills | Status: DC

## 2023-08-02 NOTE — ED Triage Notes (Signed)
 Nasal drainage since Sunday.   Productive Cough started Monday.   Has been taking Claritin x 2 weeks.  Fever started yesterday.

## 2023-08-02 NOTE — ED Provider Notes (Signed)
 [facility]    CSN: [id number] Arrival date & time: 08/02/23  0801      History   Chief Complaint No chief complaint on file.   HPI Chase Carson is a 53 y.o. male.   The history is provided by the patient.   Patient presents for complaints of fever, fatigue, nasal congestion, sinus drainage, and cough that is present for the past several days.  Tmax 102.  Patient reports his last fever was this morning which was around 101.  Denies headache, ear pain, wheezing, difficulty breathing, chest pain, abdominal pain, nausea, vomiting, diarrhea, or rash.  Patient reports he has been taking Claritin for the past 2 weeks.  Also states he has tried several over-the-counter medications with minimal relief of his symptoms.  Patient denies any obvious known sick contacts.  Past Medical History:  Diagnosis Date   Acute bronchitis 07/25/2022   Hyperlipidemia    Hypertension    Hypothyroidism    Sleep apnea, obstructive     Patient Active Problem List   Diagnosis Date Noted   Respiratory infection 05/31/2023   Annual physical exam 01/29/2023   Mixed hyperlipidemia 11/28/2022   Tobacco abuse 11/28/2022   Hypogonadism in male 03/21/2022   Vitiligo 04/28/2020   Diabetes mellitus without complication (HCC) 01/28/2020   Hypothyroidism 01/28/2020   Obstructive sleep apnea 02/14/2017   Erectile dysfunction 12/14/2013   Essential hypertension, benign 11/19/2012    Past Surgical History:  Procedure Laterality Date   None         Home Medications    Prior to Admission medications   Medication Sig Start Date End Date Taking? Authorizing Provider  amoxicillin-clavulanate (AUGMENTIN) 875-125 MG tablet Take 1 tablet by mouth every 12 (twelve) hours. 08/02/23  Yes Leath-Warren, Sadie Haber, NP  albuterol (VENTOLIN HFA) 108 (90 Base) MCG/ACT inhaler Inhale 2 puffs into the lungs every 6 (six) hours as needed for wheezing or shortness of breath. 01/24/22   Tommie Sams, DO   clomiPHENE (CLOMID) 50 MG tablet TAKE 1/2 tablet daily 07/02/23   Jerilee Field, MD  enalapril (VASOTEC) 20 MG tablet Take 1 tablet by mouth once daily 07/18/23   Everlene Other G, DO  fluticasone (FLOVENT HFA) 44 MCG/ACT inhaler Inhale 2 puffs by mouth twice daily. 01/24/22   Tommie Sams, DO  levothyroxine (SYNTHROID) 100 MCG tablet Take 1 tablet (100 mcg total) by mouth daily. Take with 88 mcg for a total of 188 mcg. 01/29/23   Tommie Sams, DO  levothyroxine (SYNTHROID) 88 MCG tablet Take 1 tablet (88 mcg total) by mouth daily. Take with 100 mcg for a total of 188 mcg. 01/29/23   Tommie Sams, DO  tadalafil (CIALIS) 20 MG tablet Take 1 tablet (20 mg total) by mouth daily as needed for erectile dysfunction. 07/25/22   Tommie Sams, DO    Family History Family History  Problem Relation Age of Onset   Cancer Father        lung   Peripheral Artery Disease Father    Cancer Sister        breast   Stroke Other     Social History Social History   Tobacco Use   Smoking status: Every Day    Current packs/day: 1.00    Average packs/day: 1 pack/day for 32.0 years (32.0 ttl pk-yrs)    Types: Cigarettes   Smokeless tobacco: Never  Vaping Use   Vaping status: Never Used  Substance Use Topics  Alcohol use: Not Currently   Drug use: Never     Allergies   Lopid [gemfibrozil]   Review of Systems Review of Systems Per HPI  Physical Exam Triage Vital Signs ED Triage Vitals  Encounter Vitals Group     BP 08/02/23 0812 (!) 146/91     Systolic BP Percentile --      Diastolic BP Percentile --      Pulse Rate 08/02/23 0812 95     Resp 08/02/23 0812 18     Temp 08/02/23 0812 98 F (36.7 C)     Temp Source 08/02/23 0812 Oral     SpO2 08/02/23 0812 98 %     Weight --      Height --      Head Circumference --      Peak Flow --      Pain Score 08/02/23 0814 0     Pain Loc --      Pain Education --      Exclude from Growth Chart --    No data found.  Updated Vital Signs BP  (!) 146/91 (BP Location: Right Arm)   Pulse 95   Temp 98 F (36.7 C) (Oral)   Resp 18   SpO2 98%   Visual Acuity Right Eye Distance:   Left Eye Distance:   Bilateral Distance:    Right Eye Near:   Left Eye Near:    Bilateral Near:     Physical Exam Vitals and nursing note reviewed.  Constitutional:      General: He is not in acute distress.    Appearance: Normal appearance.  HENT:     Head: Normocephalic.     Right Ear: Tympanic membrane, ear canal and external ear normal.     Left Ear: Tympanic membrane, ear canal and external ear normal.     Nose: Congestion present.     Right Turbinates: Enlarged and swollen.     Left Turbinates: Enlarged and swollen.     Right Sinus: No maxillary sinus tenderness or frontal sinus tenderness.     Left Sinus: No maxillary sinus tenderness or frontal sinus tenderness.     Mouth/Throat:     Lips: Pink.     Mouth: Mucous membranes are moist.     Pharynx: Uvula midline. Postnasal drip present. No pharyngeal swelling, oropharyngeal exudate, posterior oropharyngeal erythema or uvula swelling.  Eyes:     Extraocular Movements: Extraocular movements intact.     Conjunctiva/sclera: Conjunctivae normal.     Pupils: Pupils are equal, round, and reactive to light.  Cardiovascular:     Rate and Rhythm: Normal rate and regular rhythm.     Pulses: Normal pulses.     Heart sounds: Normal heart sounds.  Pulmonary:     Effort: Pulmonary effort is normal. No respiratory distress.     Breath sounds: Normal breath sounds. No stridor. No wheezing, rhonchi or rales.  Abdominal:     General: Bowel sounds are normal.     Palpations: Abdomen is soft.     Tenderness: There is no abdominal tenderness.  Musculoskeletal:     Cervical back: Normal range of motion.  Lymphadenopathy:     Cervical: No cervical adenopathy.  Skin:    General: Skin is warm and dry.  Neurological:     General: No focal deficit present.     Mental Status: He is alert and oriented  to person, place, and time.  Psychiatric:        Mood  and Affect: Mood normal.        Behavior: Behavior normal.      UC Treatments / Results  Labs (all labs ordered are listed, but only abnormal results are displayed) Labs Reviewed  POC COVID19/FLU A&B COMBO - Normal    EKG   Radiology No results found.  Procedures Procedures (including critical care time)  Medications Ordered in UC Medications - No data to display  Initial Impression / Assessment and Plan / UC Course  I have reviewed the triage vital signs and the nursing notes.  Pertinent labs & imaging results that were available during my care of the patient were reviewed by me and considered in my medical decision making (see chart for details).  The COVID/flu test was negative.  Patient has been febrile with Tmax of 102 since symptoms started.  He also has moderate congestion and postnasal drainage.  Based on current symptoms, will treat with Augmentin 875/125 mg x 7 days for acute sinusitis.  Supportive care recommendations were provided and discussed with the patient to include fluids, rest, over-the-counter analgesics, and normal saline nasal spray for congestion.  Discussed indications regarding follow-up.  Patient was in agreement with this plan of care and verbalized understanding.  All questions were answered.  Patient stable for discharge.  Final Clinical Impressions(s) / UC Diagnoses   Final diagnoses:  Acute sinusitis, recurrence not specified, unspecified location     Discharge Instructions      Take medication as directed. Continue your current allergy medication regimen. Increase fluids and get plenty of rest. May take over-the-counter iIuprofen or Tylenol as needed for pain, fever, or general discomfort. Recommend normal saline nasal spray to help with nasal congestion throughout the day. For your cough, continue use of your CPAP machine. If symptoms fail to improve with this treatment, please  follow-up with your primary care physician for further evaluation. Follow-up as needed.      ED Prescriptions     Medication Sig Dispense Auth. Provider   amoxicillin-clavulanate (AUGMENTIN) 875-125 MG tablet Take 1 tablet by mouth every 12 (twelve) hours. 14 tablet Leath-Warren, Sadie Haber, NP      PDMP not reviewed this encounter.   Abran Cantor, NP 08/02/23 [phone]

## 2023-08-02 NOTE — Discharge Instructions (Signed)
 Take medication as directed. Continue your current allergy medication regimen. Increase fluids and get plenty of rest. May take over-the-counter iIuprofen or Tylenol as needed for pain, fever, or general discomfort. Recommend normal saline nasal spray to help with nasal congestion throughout the day. For your cough, continue use of your CPAP machine. If symptoms fail to improve with this treatment, please follow-up with your primary care physician for further evaluation. Follow-up as needed.

## 2023-08-31 ENCOUNTER — Ambulatory Visit: Payer: BC Managed Care – PPO | Admitting: Family Medicine

## 2023-08-31 VITALS — BP 140/84 | HR 86 | Temp 98.2°F | Ht 71.5 in | Wt 264.0 lb

## 2023-08-31 DIAGNOSIS — I10 Essential (primary) hypertension: Secondary | ICD-10-CM | POA: Diagnosis not present

## 2023-08-31 DIAGNOSIS — D72829 Elevated white blood cell count, unspecified: Secondary | ICD-10-CM

## 2023-08-31 DIAGNOSIS — E119 Type 2 diabetes mellitus without complications: Secondary | ICD-10-CM

## 2023-08-31 DIAGNOSIS — T466X5A Adverse effect of antihyperlipidemic and antiarteriosclerotic drugs, initial encounter: Secondary | ICD-10-CM

## 2023-08-31 DIAGNOSIS — E782 Mixed hyperlipidemia: Secondary | ICD-10-CM

## 2023-08-31 DIAGNOSIS — G720 Drug-induced myopathy: Secondary | ICD-10-CM | POA: Insufficient documentation

## 2023-08-31 NOTE — Patient Instructions (Signed)
Labs today.  Follow up in 6 months.  Take care  Dr. Alyxis Grippi  

## 2023-09-01 LAB — LIPID PANEL
Chol/HDL Ratio: 7.3 ratio — ABNORMAL HIGH (ref 0.0–5.0)
Cholesterol, Total: 175 mg/dL (ref 100–199)
HDL: 24 mg/dL — ABNORMAL LOW (ref >39)
LDL Chol Calc (NIH): 110 mg/dL — ABNORMAL HIGH (ref 0–99)
Triglycerides: 236 mg/dL — ABNORMAL HIGH (ref 0–149)
VLDL Cholesterol Cal: 41 mg/dL — ABNORMAL HIGH (ref 5–40)

## 2023-09-01 LAB — HEMOGLOBIN A1C
Est. average glucose Bld gHb Est-mCnc: 143 mg/dL
Hgb A1c MFr Bld: 6.6 % — ABNORMAL HIGH (ref 4.8–5.6)

## 2023-09-01 LAB — CMP14+EGFR
ALT: 34 IU/L (ref 0–44)
AST: 24 IU/L (ref 0–40)
Albumin: 4.6 g/dL (ref 3.8–4.9)
Alkaline Phosphatase: 94 IU/L (ref 44–121)
BUN/Creatinine Ratio: 10 (ref 9–20)
BUN: 8 mg/dL (ref 6–24)
Bilirubin Total: 0.9 mg/dL (ref 0.0–1.2)
CO2: 24 mmol/L (ref 20–29)
Calcium: 9.8 mg/dL (ref 8.7–10.2)
Chloride: 101 mmol/L (ref 96–106)
Creatinine, Ser: 0.79 mg/dL (ref 0.76–1.27)
Globulin, Total: 2.5 g/dL (ref 1.5–4.5)
Glucose: 114 mg/dL — ABNORMAL HIGH (ref 70–99)
Potassium: 4.1 mmol/L (ref 3.5–5.2)
Sodium: 141 mmol/L (ref 134–144)
Total Protein: 7.1 g/dL (ref 6.0–8.5)
eGFR: 107 mL/min/{1.73_m2} (ref >59)

## 2023-09-01 LAB — CBC
Hematocrit: 50.3 % (ref 37.5–51.0)
Hemoglobin: 16.7 g/dL (ref 13.0–17.7)
MCH: 30.5 pg (ref 26.6–33.0)
MCHC: 33.2 g/dL (ref 31.5–35.7)
MCV: 92 fL (ref 79–97)
Platelets: 236 10*3/uL (ref 150–450)
RBC: 5.47 x10E6/uL (ref 4.14–5.80)
RDW: 13.1 % (ref 11.6–15.4)
WBC: 13.4 10*3/uL — ABNORMAL HIGH (ref 3.4–10.8)

## 2023-09-02 ENCOUNTER — Encounter: Payer: Self-pay | Admitting: Family Medicine

## 2023-09-02 MED ORDER — ENALAPRIL MALEATE 20 MG PO TABS
20.0000 mg | ORAL_TABLET | Freq: Every day | ORAL | 1 refills | Status: AC
Start: 2023-09-02 — End: ?

## 2023-09-02 NOTE — Assessment & Plan Note (Signed)
 Uncontrolled. LDL 110.  Will recheck with the patient in regards to how he wants to proceed.  Recommend Nexletol or Repatha given statin intolerance.

## 2023-09-02 NOTE — Assessment & Plan Note (Addendum)
 A1c at 6.6. At goal. Continue lifestyle measures.

## 2023-09-02 NOTE — Progress Notes (Signed)
 Subjective:  Patient ID: Chase Carson, male    DOB: 06/28/1970  Age: 53 y.o. MRN: [id number]  CC:   Chief Complaint  Patient presents with   Hypertension   Hypothyroidism    HPI:  53 year old male presents for follow-up.  Fair control of BP on enalapril .  Patient has not tolerated statins.  Needs reevaluation of lipids.  Type 2 diabetes has been stable without medication. Needs A1c today.  Patient Active Problem List   Diagnosis Date Noted   Statin myopathy 08/31/2023   Mixed hyperlipidemia 11/28/2022   Tobacco abuse 11/28/2022   Hypogonadism in male 03/21/2022   Vitiligo 04/28/2020   Diabetes mellitus without complication (HCC) 01/28/2020   Hypothyroidism 01/28/2020   Obstructive sleep apnea 02/14/2017   Erectile dysfunction 12/14/2013   Essential hypertension, benign 11/19/2012    Social Hx   Social History   Socioeconomic History   Marital status: Married    Spouse name: Not on file   Number of children: Not on file   Years of education: Not on file   Highest education level: Some college, no degree  Occupational History   Not on file  Tobacco Use   Smoking status: Every Day    Current packs/day: 1.00    Average packs/day: 1 pack/day for 32.0 years (32.0 ttl pk-yrs)    Types: Cigarettes   Smokeless tobacco: Never  Vaping Use   Vaping status: Never Used  Substance and Sexual Activity   Alcohol use: Not Currently   Drug use: Never   Sexual activity: Yes    Birth control/protection: None  Other Topics Concern   Not on file  Social History Narrative   Not on file   Social Drivers of Health   Financial Resource Strain: Low Risk  (05/27/2023)   Overall Financial Resource Strain (CARDIA)    Difficulty of Paying Living Expenses: Not very hard  Food Insecurity: No Food Insecurity (05/27/2023)   Hunger Vital Sign    Worried About Running Out of Food in the Last Year: Never true    Ran Out of Food in the Last Year: Never true  Transportation  Needs: No Transportation Needs (05/27/2023)   PRAPARE - Administrator, Civil Service (Medical): No    Lack of Transportation (Non-Medical): No  Physical Activity: Insufficiently Active (05/27/2023)   Exercise Vital Sign    Days of Exercise per Week: 1 day    Minutes of Exercise per Session: 10 min  Stress: Stress Concern Present (05/27/2023)   [employer] of Occupational Health - Occupational Stress Questionnaire    Feeling of Stress : To some extent  Social Connections: Socially Isolated (05/27/2023)   Social Connection and Isolation Panel [NHANES]    Frequency of Communication with Friends and Family: Once a week    Frequency of Social Gatherings with Friends and Family: Never    Attends Religious Services: Never    Database administrator or Organizations: No    Attends Engineer, structural: Not on file    Marital Status: Married    Review of Systems  Constitutional: Negative.   Respiratory: Negative.    Cardiovascular: Negative.    Objective:  BP (!) 140/84   Pulse 86   Temp 98.2 F (36.8 C)   Ht 5' 11.5" (1.816 m)   Wt 264 lb (119.7 kg)   SpO2 100%   BMI 36.31 kg/m      08/31/2023    9:05 AM 08/31/2023    8:30  AM 08/02/2023    8:12 AM  BP/Weight  Systolic BP 140 148 146  Diastolic BP 84 100 91  Wt. (Lbs)  264   BMI  36.31 kg/m2     Physical Exam Vitals and nursing note reviewed.  Constitutional:      General: He is not in acute distress.    Appearance: Normal appearance.  HENT:     Head: Normocephalic and atraumatic.  Eyes:     General:        Right eye: No discharge.        Left eye: No discharge.     Conjunctiva/sclera: Conjunctivae normal.  Cardiovascular:     Rate and Rhythm: Normal rate and regular rhythm.  Pulmonary:     Effort: Pulmonary effort is normal.     Breath sounds: Normal breath sounds. No wheezing, rhonchi or rales.  Neurological:     Mental Status: He is alert.  Psychiatric:        Mood and Affect: Mood  normal.        Behavior: Behavior normal.     Lab Results  Component Value Date   WBC 13.4 (H) 08/31/2023   HGB 16.7 08/31/2023   HCT 50.3 08/31/2023   PLT 236 08/31/2023   GLUCOSE 114 (H) 08/31/2023   CHOL 175 08/31/2023   TRIG 236 (H) 08/31/2023   HDL 24 (L) 08/31/2023   LDLCALC 110 (H) 08/31/2023   ALT 34 08/31/2023   AST 24 08/31/2023   NA 141 08/31/2023   K 4.1 08/31/2023   CL 101 08/31/2023   CREATININE 0.79 08/31/2023   BUN 8 08/31/2023   CO2 24 08/31/2023   TSH 3.280 05/31/2023   HGBA1C 6.6 (H) 08/31/2023     Assessment & Plan:  Essential hypertension, benign Assessment & Plan: Fair control.  Continue dosing of enalapril .  May need dose increase in the future.  Orders: -     Enalapril  Maleate; Take 1 tablet (20 mg total) by mouth daily.  Dispense: 90 tablet; Refill: 1  Statin myopathy  Diabetes mellitus without complication (HCC) Assessment & Plan: A1c at 6.6. At goal. Continue lifestyle measures.  Orders: -     CMP14+EGFR -     Hemoglobin A1c  Mixed hyperlipidemia Assessment & Plan: Uncontrolled. LDL 110.  Will recheck with the patient in regards to how he wants to proceed.  Recommend Nexletol or Repatha given statin intolerance.  Orders: -     Lipid panel  Leukocytosis, unspecified type -     CBC    Follow-up:  6 months  Sabrina Arriaga Debrah Fan DO [facility]

## 2023-09-02 NOTE — Assessment & Plan Note (Signed)
 Fair control.  Continue dosing of enalapril .  May need dose increase in the future.

## 2023-12-18 ENCOUNTER — Ambulatory Visit
Admission: EM | Admit: 2023-12-18 | Discharge: 2023-12-18 | Disposition: A | Discharge status: Home / Self Care | Attending: Family Medicine | Admitting: Family Medicine

## 2023-12-18 DIAGNOSIS — R030 Elevated blood-pressure reading, without diagnosis of hypertension: Secondary | ICD-10-CM | POA: Diagnosis not present

## 2023-12-18 DIAGNOSIS — J069 Acute upper respiratory infection, unspecified: Secondary | ICD-10-CM

## 2023-12-18 MED ORDER — AZELASTINE HCL 0.1 % NA SOLN
1.0000 | Freq: Two times a day (BID) | NASAL | 0 refills | Status: DC
Start: 2023-12-18 — End: 2024-01-30

## 2023-12-18 MED ORDER — LIDOCAINE VISCOUS HCL 2 % MT SOLN
10.0000 mL | OROMUCOSAL | 0 refills | Status: DC | PRN
Start: 2023-12-18 — End: 2024-01-30

## 2023-12-18 MED ORDER — PREDNISONE 20 MG PO TABS: 40.0000 mg | ORAL_TABLET | Freq: Every day | ORAL | 0 refills | Status: DC

## 2023-12-18 NOTE — ED Triage Notes (Signed)
 Pt reports sore throat, tonsils feel inflamed, cough and congestion, x 6 days. Difficulty swallowing.

## 2023-12-18 NOTE — ED Provider Notes (Signed)
 [facility]    CSN: [id number] Arrival date & time: 12/18/23  0806      History   Chief Complaint No chief complaint on file.   HPI Chase Carson is a 53 y.o. male.   Patient presenting today with 6-day history of sore swollen feeling throat, congestion, cough.  Denies fever, chills, body aches, chest pain, shortness of breath, abdominal pain, vomiting, diarrhea.  So far trying his typical allergy medication as well as cold and congestion medication with no relief.    Past Medical History:  Diagnosis Date   Acute bronchitis 07/25/2022   Hyperlipidemia    Hypertension    Hypothyroidism    Sleep apnea, obstructive     Patient Active Problem List   Diagnosis Date Noted   Statin myopathy 08/31/2023   Mixed hyperlipidemia 11/28/2022   Tobacco abuse 11/28/2022   Hypogonadism in male 03/21/2022   Vitiligo 04/28/2020   Diabetes mellitus without complication (HCC) 01/28/2020   Hypothyroidism 01/28/2020   Obstructive sleep apnea 02/14/2017   Erectile dysfunction 12/14/2013   Essential hypertension, benign 11/19/2012    Past Surgical History:  Procedure Laterality Date   None         Home Medications    Prior to Admission medications   Medication Sig Start Date End Date Taking? Authorizing Provider  azelastine  (ASTELIN ) 0.1 % nasal spray Place 1 spray into both nostrils 2 (two) times daily. Use in each nostril as directed 12/18/23  Yes Stuart Vernell Norris, PA-C  lidocaine  (XYLOCAINE ) 2 % solution Use as directed 10 mLs in the mouth or throat every 3 (three) hours as needed. 12/18/23  Yes Stuart Vernell Norris, PA-C  predniSONE  (DELTASONE ) 20 MG tablet Take 2 tablets (40 mg total) by mouth daily with breakfast. 12/18/23  Yes Stuart Vernell Norris, PA-C  albuterol  (VENTOLIN  HFA) 108 (90 Base) MCG/ACT inhaler Inhale 2 puffs into the lungs every 6 (six) hours as needed for wheezing or shortness of breath. 01/24/22   Cook, Jayce G, DO  clomiPHENE  (CLOMID ) 50  MG tablet TAKE 1/2 tablet daily 07/02/23   Nieves Cough, MD  enalapril  (VASOTEC ) 20 MG tablet Take 1 tablet (20 mg total) by mouth daily. 09/02/23   Cook, Jayce G, DO  fluticasone  (FLOVENT  HFA) 44 MCG/ACT inhaler Inhale 2 puffs by mouth twice daily. 01/24/22   Cook, Jayce G, DO  levothyroxine  (SYNTHROID ) 100 MCG tablet Take 1 tablet (100 mcg total) by mouth daily. Take with 88 mcg for a total of 188 mcg. 01/29/23   Cook, Jayce G, DO  levothyroxine  (SYNTHROID ) 88 MCG tablet Take 1 tablet (88 mcg total) by mouth daily. Take with 100 mcg for a total of 188 mcg. 01/29/23   Cook, Jayce G, DO  tadalafil  (CIALIS ) 20 MG tablet Take 1 tablet (20 mg total) by mouth daily as needed for erectile dysfunction. 07/25/22   Cook, Jayce G, DO    Family History Family History  Problem Relation Age of Onset   Cancer Father        lung   Peripheral Artery Disease Father    Cancer Sister        breast   Stroke Other     Social History Social History   Tobacco Use   Smoking status: Every Day    Current packs/day: 1.00    Average packs/day: 1 pack/day for 32.0 years (32.0 ttl pk-yrs)    Types: Cigarettes   Smokeless tobacco: Never  Vaping Use   Vaping status: Never Used  Substance Use Topics   Alcohol use: Not Currently   Drug use: Never     Allergies   Lopid [gemfibrozil]   Review of Systems Review of Systems Per HPI  Physical Exam Triage Vital Signs ED Triage Vitals  Encounter Vitals Group     BP 12/18/23 0817 (!) 162/97     Girls Systolic BP Percentile --      Girls Diastolic BP Percentile --      Boys Systolic BP Percentile --      Boys Diastolic BP Percentile --      Pulse Rate 12/18/23 0817 89     Resp 12/18/23 0817 16     Temp 12/18/23 0817 98.4 F (36.9 C)     Temp src --      SpO2 12/18/23 0817 98 %     Weight --      Height --      Head Circumference --      Peak Flow --      Pain Score 12/18/23 0818 0     Pain Loc --      Pain Education --      Exclude from Growth  Chart --    No data found.  Updated Vital Signs BP (!) 162/97 (BP Location: Right Arm)   Pulse 89   Temp 98.4 F (36.9 C)   Resp 16   SpO2 98%   Visual Acuity Right Eye Distance:   Left Eye Distance:   Bilateral Distance:    Right Eye Near:   Left Eye Near:    Bilateral Near:     Physical Exam Vitals and nursing note reviewed.  Constitutional:      Appearance: He is well-developed.  HENT:     Head: Atraumatic.     Right Ear: External ear normal.     Left Ear: External ear normal.     Nose: Rhinorrhea present.     Mouth/Throat:     Mouth: Mucous membranes are moist.     Pharynx: Oropharynx is clear. Posterior oropharyngeal erythema present. No oropharyngeal exudate.     Comments: Very mild tonsillar erythema, no appreciable edema, exudates.  Uvula midline, oral airway patent Eyes:     Conjunctiva/sclera: Conjunctivae normal.     Pupils: Pupils are equal, round, and reactive to light.  Cardiovascular:     Rate and Rhythm: Normal rate and regular rhythm.  Pulmonary:     Effort: Pulmonary effort is normal. No respiratory distress.     Breath sounds: No wheezing or rales.  Musculoskeletal:        General: Normal range of motion.     Cervical back: Normal range of motion and neck supple.  Lymphadenopathy:     Cervical: No cervical adenopathy.  Skin:    General: Skin is warm and dry.  Neurological:     Mental Status: He is alert and oriented to person, place, and time.  Psychiatric:        Behavior: Behavior normal.      UC Treatments / Results  Labs (all labs ordered are listed, but only abnormal results are displayed) Labs Reviewed - No data to display  EKG   Radiology No results found.  Procedures Procedures (including critical care time)  Medications Ordered in UC Medications - No data to display  Initial Impression / Assessment and Plan / UC Course  I have reviewed the triage vital signs and the nursing notes.  Pertinent labs & imaging  results that were available  during my care of the patient were reviewed by me and considered in my medical decision making (see chart for details).     Suspect viral respiratory infection.  Treat with a short course of prednisone , Astelin , viscous lidocaine , supportive over-the-counter medications and home care.  Return for worsening symptoms.  Does also have some mildly elevated blood pressure in clinic, continue to monitor this at home and follow-up with PCP if not resolving.  Coricidin HBP, plain Mucinex to avoid further increasing blood pressure readings  Final Clinical Impressions(s) / UC Diagnoses   Final diagnoses:  Viral URI  Elevated blood pressure reading   Discharge Instructions   None    ED Prescriptions     Medication Sig Dispense Auth. Provider   predniSONE  (DELTASONE ) 20 MG tablet Take 2 tablets (40 mg total) by mouth daily with breakfast. 10 tablet Stuart Vernell Norris, PA-C   azelastine  (ASTELIN ) 0.1 % nasal spray Place 1 spray into both nostrils 2 (two) times daily. Use in each nostril as directed 30 mL Stuart Vernell Norris, PA-C   lidocaine  (XYLOCAINE ) 2 % solution Use as directed 10 mLs in the mouth or throat every 3 (three) hours as needed. 100 mL Stuart Vernell Norris, [place]      PDMP not reviewed this encounter.   Stuart Vernell Sullivan, [place] 12/18/23 [phone]

## 2023-12-31 ENCOUNTER — Ambulatory Visit: Payer: BC Managed Care – PPO | Admitting: Urology

## 2023-12-31 VITALS — BP 156/88 | HR 93

## 2023-12-31 DIAGNOSIS — R7989 Other specified abnormal findings of blood chemistry: Secondary | ICD-10-CM

## 2023-12-31 DIAGNOSIS — N529 Male erectile dysfunction, unspecified: Secondary | ICD-10-CM | POA: Diagnosis not present

## 2023-12-31 DIAGNOSIS — E291 Testicular hypofunction: Secondary | ICD-10-CM | POA: Diagnosis not present

## 2023-12-31 LAB — URINALYSIS, ROUTINE W REFLEX MICROSCOPIC
Bilirubin, UA: NEGATIVE
Ketones, UA: NEGATIVE
Leukocytes,UA: NEGATIVE
Nitrite, UA: NEGATIVE
Protein,UA: NEGATIVE
RBC, UA: NEGATIVE
Specific Gravity, UA: 1.015 (ref 1.005–1.030)
Urobilinogen, Ur: 1 mg/dL (ref 0.2–1.0)
pH, UA: 6.5 (ref 5.0–7.5)

## 2023-12-31 MED ORDER — CLOMIPHENE CITRATE 50 MG PO TABS: ORAL_TABLET | ORAL | 3 refills | Status: AC

## 2023-12-31 NOTE — Progress Notes (Unsigned)
 12/31/2023 10:02 AM   Chase Carson [employer] 05/26/1970 [id number]  Referring provider: Cook, Jayce G, DO [address] [place],  [place] [address]  No chief complaint on file.   HPI:  F/u -    1) low T - saw Drs. Stoneking and Dahlstedt -  March 2020 for T226.  November 2023 T 257, PSA 1, hematocrit 45.7, LH 6.3, P 5.0. Prescription for Clomid .  1 tab Monday, Thursday.  May 2024 T 485.   2) ED - since 2014. Tried pde5i and ICI.    Today, seen for the above. Was taking clomiphene  1 tab Mon and Thurs and then increased to 1/2 tab daily. Ran out last week. Jan 2025 Hct 47.9. Back on  clomiphene  1/2 tab daily his 02/25 PSA 1.3 and T 528. His 04/25 Hct 50.3. Takes cialis . Started more supplements.    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 [employer]. [employer].    PMH: Past Medical History:  Diagnosis Date   Acute bronchitis 07/25/2022   Hyperlipidemia    Hypertension    Hypothyroidism    Sleep apnea, obstructive     Surgical History: Past Surgical History:  Procedure Laterality Date   None      Home Medications:  Allergies as of 12/31/2023       Reactions   Lopid [gemfibrozil] Other (See Comments)   Joint aches.        Medication List        Accurate as of December 31, 2023 10:02 AM. If you have any questions, ask your nurse or doctor.          albuterol  108 (90 Base) MCG/ACT inhaler Commonly known as: VENTOLIN  HFA Inhale 2 puffs into the lungs every 6 (six) hours as needed for wheezing or shortness of breath.   azelastine  0.1 % nasal spray Commonly known as: ASTELIN  Place 1 spray into both nostrils 2 (two) times daily. Use in each nostril as directed   clomiPHENE  50 MG tablet Commonly known as: Clomid  TAKE 1/2 tablet daily   enalapril  20 MG tablet Commonly known as: VASOTEC  Take 1 tablet (20 mg total) by mouth daily.   fluticasone  44 MCG/ACT inhaler Commonly known as: Flovent  HFA Inhale 2 puffs by mouth twice  daily.   levothyroxine  100 MCG tablet Commonly known as: SYNTHROID  Take 1 tablet (100 mcg total) by mouth daily. Take with 88 mcg for a total of 188 mcg.   levothyroxine  88 MCG tablet Commonly known as: SYNTHROID  Take 1 tablet (88 mcg total) by mouth daily. Take with 100 mcg for a total of 188 mcg.   lidocaine  2 % solution Commonly known as: XYLOCAINE  Use as directed 10 mLs in the mouth or throat every 3 (three) hours as needed.   predniSONE  20 MG tablet Commonly known as: DELTASONE  Take 2 tablets (40 mg total) by mouth daily with breakfast.   tadalafil  20 MG tablet Commonly known as: CIALIS  Take 1 tablet (20 mg total) by mouth daily as needed for erectile dysfunction.        Allergies:  Allergies  Allergen Reactions   Lopid [Gemfibrozil] Other (See Comments)    Joint aches.    Family History: Family History  Problem Relation Age of Onset   Cancer Father        lung   Peripheral Artery Disease Father    Cancer Sister        breast   Stroke Other  Social History:  reports that he has been smoking cigarettes. He has a 32 pack-year smoking history. He has never used smokeless tobacco. He reports that he does not currently use alcohol. He reports that he does not use drugs.   Physical Exam: BP (!) 156/88   Pulse 93   Constitutional:  Alert and oriented, No acute distress. HEENT: [place] AT, moist mucus membranes.  Trachea midline, no masses. Cardiovascular: No clubbing, cyanosis, or edema. Respiratory: Normal respiratory effort, no increased work of breathing. GI: Abdomen is soft, nontender, nondistended, no abdominal masses GU: No CVA tenderness Skin: No rashes, bruises or suspicious lesions. Neurologic: Grossly intact, no focal deficits, moving all 4 extremities. Psychiatric: Normal mood and affect.  Laboratory Data: Lab Results  Component Value Date   WBC 13.4 (H) 08/31/2023   HGB 16.7 08/31/2023   HCT 50.3 08/31/2023   MCV 92 08/31/2023   PLT 236  08/31/2023    Lab Results  Component Value Date   CREATININE 0.79 08/31/2023    No results found for: PSA  Lab Results  Component Value Date   TESTOSTERONE  528 07/02/2023    Lab Results  Component Value Date   HGBA1C 6.6 (H) 08/31/2023    Urinalysis    Component Value Date/Time   COLORURINE YELLOW 02/13/2010 1355   APPEARANCEUR Clear 07/02/2023 1005   LABSPEC >1.030 (H) 02/13/2010 1355   PHURINE 6.0 02/13/2010 1355   GLUCOSEU Negative 07/02/2023 1005   HGBUR NEGATIVE 02/13/2010 1355   BILIRUBINUR Negative 07/02/2023 1005   KETONESUR TRACE (A) 02/13/2010 1355   PROTEINUR Negative 07/02/2023 1005   PROTEINUR NEGATIVE 02/13/2010 1355   UROBILINOGEN 0.2 02/13/2010 1355   NITRITE Negative 07/02/2023 1005   NITRITE NEGATIVE 02/13/2010 1355   LEUKOCYTESUR Negative 07/02/2023 1005    Lab Results  Component Value Date   LABMICR Comment 07/02/2023    Pertinent Imaging: N/a  Assessment & Plan:    1. Hypogonadism in male (Primary) --- check T, E, Hct. F/u 6 months. Takes cialis .  - Urinalysis, Routine w reflex microscopic  2. ED - cont cialis    No follow-ups on file.  Donnice Brooks, MD  [facility]  [address] [place], [place] [address] [phone]

## 2024-01-17 LAB — ESTRADIOL, FREE
Estradiol, Serum, MS: 24 pg/mL
Free Estradiol, Percent: 2.5 %
Free Estradiol, Serum: 0.6 pg/mL

## 2024-01-17 LAB — HEMOGLOBIN AND HEMATOCRIT, BLOOD
Hematocrit: 46.6 % (ref 37.5–51.0)
Hemoglobin: 14.7 g/dL (ref 13.0–17.7)

## 2024-01-17 LAB — TESTOSTERONE

## 2024-01-18 ENCOUNTER — Ambulatory Visit: Payer: Self-pay | Admitting: Urology

## 2024-01-30 ENCOUNTER — Ambulatory Visit: Admitting: Family Medicine

## 2024-01-30 VITALS — BP 131/87 | HR 96 | Temp 97.9°F | Ht 71.5 in | Wt 265.0 lb

## 2024-01-30 DIAGNOSIS — Z0000 Encounter for general adult medical examination without abnormal findings: Secondary | ICD-10-CM

## 2024-01-30 NOTE — Patient Instructions (Signed)
 Follow up in 6 months.  Do the Cologuard.  Consider lung cancer screening.

## 2024-01-31 DIAGNOSIS — Z0000 Encounter for general adult medical examination without abnormal findings: Secondary | ICD-10-CM | POA: Insufficient documentation

## 2024-01-31 NOTE — Assessment & Plan Note (Signed)
 Overall doing fairly well.  Form filled out for work.  Discussed preventative healthcare items.

## 2024-01-31 NOTE — Progress Notes (Signed)
 Subjective:  Patient ID: Chase Carson, male    DOB: 11/10/1970  Age: 53 y.o. MRN: [id number]  CC:   Chief Complaint  Patient presents with   Annual Exam    HPI:  53 year old male presents for an annual physical exam.  Patient needs a form filled out for work.  He has had fairly recent labs.  We are going to use those labs in regards to his paperwork.  BP initially elevated here today.  Improved on repeat.    Patient is in need of several preventative healthcare items.  Time is a big issue for him.  He works at least 6 days a week.  He is a candidate for lung cancer screening.  He has Cologuard at home and has not done it.  He does not want any vaccines at this time.  Needs an updated eye exam.  Patient Active Problem List   Diagnosis Date Noted   Annual physical exam 01/31/2024   Statin myopathy 08/31/2023   Mixed hyperlipidemia 11/28/2022   Tobacco abuse 11/28/2022   Hypogonadism in male 03/21/2022   Vitiligo 04/28/2020   Diabetes mellitus without complication (HCC) 01/28/2020   Hypothyroidism 01/28/2020   Obstructive sleep apnea 02/14/2017   Erectile dysfunction 12/14/2013   Essential hypertension, benign 11/19/2012    Social Hx   Social History   Socioeconomic History   Marital status: Married    Spouse name: Not on file   Number of children: Not on file   Years of education: Not on file   Highest education level: Some college, no degree  Occupational History   Not on file  Tobacco Use   Smoking status: Every Day    Current packs/day: 1.00    Average packs/day: 1 pack/day for 32.0 years (32.0 ttl pk-yrs)    Types: Cigarettes   Smokeless tobacco: Never  Vaping Use   Vaping status: Never Used  Substance and Sexual Activity   Alcohol use: Not Currently   Drug use: Never   Sexual activity: Yes    Birth control/protection: None  Other Topics Concern   Not on file  Social History Narrative   Not on file   Social Drivers of Health   Financial  Resource Strain: Low Risk  (01/30/2024)   Overall Financial Resource Strain (CARDIA)    Difficulty of Paying Living Expenses: Not very hard  Food Insecurity: Food Insecurity Present (01/30/2024)   Hunger Vital Sign    Worried About Running Out of Food in the Last Year: Sometimes true    Ran Out of Food in the Last Year: Never true  Transportation Needs: No Transportation Needs (01/30/2024)   PRAPARE - Administrator, Civil Service (Medical): No    Lack of Transportation (Non-Medical): No  Physical Activity: Unknown (01/30/2024)   Exercise Vital Sign    Days of Exercise per Week: Patient declined    Minutes of Exercise per Session: Not on file  Stress: No Stress Concern Present (01/30/2024)   [employer] of Occupational Health - Occupational Stress Questionnaire    Feeling of Stress: Only a little  Social Connections: Socially Isolated (01/30/2024)   Social Connection and Isolation Panel    Frequency of Communication with Friends and Family: Once a week    Frequency of Social Gatherings with Friends and Family: Never    Attends Religious Services: Never    Database administrator or Organizations: No    Attends Banker Meetings: Not on file  Marital Status: Married    Review of Systems  Constitutional: Negative.   Respiratory: Negative.    Cardiovascular: Negative.    Objective:  BP 131/87   Pulse 96   Temp 97.9 F (36.6 C)   Ht 5' 11.5 (1.816 m)   Wt 265 lb (120.2 kg)   SpO2 99%   BMI 36.44 kg/m      01/30/2024    1:37 PM 01/30/2024    1:03 PM 12/31/2023    9:50 AM  BP/Weight  Systolic BP 131 161 156  Diastolic BP 87 102 88  Wt. (Lbs)  265   BMI  36.44 kg/m2     Physical Exam Vitals and nursing note reviewed.  Constitutional:      General: He is not in acute distress.    Appearance: Normal appearance.  HENT:     Head: Normocephalic and atraumatic.  Eyes:     General:        Right eye: No discharge.        Left eye: No discharge.      Conjunctiva/sclera: Conjunctivae normal.  Cardiovascular:     Rate and Rhythm: Normal rate and regular rhythm.  Pulmonary:     Effort: Pulmonary effort is normal.     Breath sounds: Normal breath sounds. No wheezing, rhonchi or rales.  Neurological:     Mental Status: He is alert.  Psychiatric:        Mood and Affect: Mood normal.        Behavior: Behavior normal.     Lab Results  Component Value Date   WBC 13.4 (H) 08/31/2023   HGB 14.7 12/31/2023   HCT 46.6 12/31/2023   PLT 236 08/31/2023   GLUCOSE 114 (H) 08/31/2023   CHOL 175 08/31/2023   TRIG 236 (H) 08/31/2023   HDL 24 (L) 08/31/2023   LDLCALC 110 (H) 08/31/2023   ALT 34 08/31/2023   AST 24 08/31/2023   NA 141 08/31/2023   K 4.1 08/31/2023   CL 101 08/31/2023   CREATININE 0.79 08/31/2023   BUN 8 08/31/2023   CO2 24 08/31/2023   TSH 3.280 05/31/2023   HGBA1C 6.6 (H) 08/31/2023     Assessment & Plan:  Annual physical exam Assessment & Plan: Overall doing fairly well.  Form filled out for work.  Discussed preventative healthcare items.     Follow-up:  6 months  Acire Tang Bluford DO [facility]

## 2024-03-02 ENCOUNTER — Other Ambulatory Visit: Payer: Self-pay | Admitting: Family Medicine

## 2024-07-14 ENCOUNTER — Ambulatory Visit: Admitting: Urology

## 2024-07-29 ENCOUNTER — Ambulatory Visit: Admitting: Family Medicine
